# Patient Record
Sex: Male | Born: 2000 | Race: Black or African American | Hispanic: No | Marital: Single | Smoking: Never smoker
Health system: Southern US, Community
[De-identification: ages and names within clinical notes are randomized; demographics above are authoritative.]

## PROBLEM LIST (undated history)

## (undated) HISTORY — PX: WISDOM TOOTH EXTRACTION: SHX21

---
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
## 2022-06-11 ENCOUNTER — Other Ambulatory Visit: Payer: Self-pay

## 2022-06-11 ENCOUNTER — Encounter (HOSPITAL_COMMUNITY): Payer: Self-pay | Admitting: *Deleted

## 2022-06-11 DIAGNOSIS — R112 Nausea with vomiting, unspecified: Secondary | ICD-10-CM | POA: Diagnosis present

## 2022-06-11 DIAGNOSIS — E860 Dehydration: Secondary | ICD-10-CM | POA: Diagnosis not present

## 2022-06-11 DIAGNOSIS — Z20822 Contact with and (suspected) exposure to covid-19: Secondary | ICD-10-CM | POA: Diagnosis not present

## 2022-06-11 LAB — COMPREHENSIVE METABOLIC PANEL
ALT: 14 U/L (ref 0–44)
AST: 23 U/L (ref 15–41)
Albumin: 4.7 g/dL (ref 3.5–5.0)
Alkaline Phosphatase: 91 U/L (ref 38–126)
Anion gap: 11 (ref 5–15)
BUN: 27 mg/dL — ABNORMAL HIGH (ref 6–20)
CO2: 24 mmol/L (ref 22–32)
Calcium: 9.1 mg/dL (ref 8.9–10.3)
Chloride: 102 mmol/L (ref 98–111)
Creatinine, Ser: 0.89 mg/dL (ref 0.61–1.24)
GFR, Estimated: >60 mL/min (ref >60)
Glucose, Bld: 129 mg/dL — ABNORMAL HIGH (ref 70–99)
Potassium: 3.9 mmol/L (ref 3.5–5.1)
Sodium: 137 mmol/L (ref 135–145)
Total Bilirubin: 1.4 mg/dL — ABNORMAL HIGH (ref 0.3–1.2)
Total Protein: 8.5 g/dL — ABNORMAL HIGH (ref 6.5–8.1)

## 2022-06-11 LAB — CBC
HCT: 43.8 % (ref 39.0–52.0)
Hemoglobin: 13.9 g/dL (ref 13.0–17.0)
MCH: 23.5 pg — ABNORMAL LOW (ref 26.0–34.0)
MCHC: 31.7 g/dL (ref 30.0–36.0)
MCV: 74 fL — ABNORMAL LOW (ref 80.0–100.0)
Platelets: 306 10*3/uL (ref 150–400)
RBC: 5.92 MIL/uL — ABNORMAL HIGH (ref 4.22–5.81)
RDW: 14.3 % (ref 11.5–15.5)
WBC: 8.5 10*3/uL (ref 4.0–10.5)
nRBC: 0 % (ref 0.0–0.2)

## 2022-06-11 LAB — LIPASE, BLOOD: Lipase: 25 U/L (ref 11–51)

## 2022-06-11 LAB — RESP PANEL BY RT-PCR (RSV, FLU A&B, COVID)  RVPGX2
Influenza A by PCR: NEGATIVE
Influenza B by PCR: NEGATIVE
Resp Syncytial Virus by PCR: NEGATIVE
SARS Coronavirus 2 by RT PCR: NEGATIVE

## 2022-06-11 NOTE — ED Triage Notes (Signed)
Pt with emesis since this morning, constant throughout the day per family member, generalized weakness.

## 2022-06-12 ENCOUNTER — Emergency Department (HOSPITAL_COMMUNITY)
Admission: EM | Admit: 2022-06-12 | Discharge: 2022-06-12 | Disposition: A | Discharge status: Home / Self Care | Payer: Medicaid Other | Attending: Emergency Medicine | Admitting: Emergency Medicine

## 2022-06-12 DIAGNOSIS — R112 Nausea with vomiting, unspecified: Secondary | ICD-10-CM

## 2022-06-12 DIAGNOSIS — E860 Dehydration: Secondary | ICD-10-CM

## 2022-06-12 LAB — URINALYSIS, ROUTINE W REFLEX MICROSCOPIC
Bacteria, UA: NONE SEEN
Bilirubin Urine: NEGATIVE
Glucose, UA: NEGATIVE mg/dL
Hgb urine dipstick: NEGATIVE
Ketones, ur: 80 mg/dL — AB
Leukocytes,Ua: NEGATIVE
Nitrite: NEGATIVE
Protein, ur: 30 mg/dL — AB
Specific Gravity, Urine: 1.033 — ABNORMAL HIGH (ref 1.005–1.030)
pH: 5 (ref 5.0–8.0)

## 2022-06-12 MED ORDER — LACTATED RINGERS IV BOLUS
1000.0000 mL | Freq: Once | INTRAVENOUS | Status: AC
  Administered 2022-06-12: 1000 mL via INTRAVENOUS

## 2022-06-12 MED ORDER — ONDANSETRON 4 MG PO TBDP: ORAL_TABLET | ORAL | 0 refills | Status: DC

## 2022-06-12 MED ORDER — ONDANSETRON HCL 4 MG/2ML IJ SOLN
4.0000 mg | Freq: Once | INTRAMUSCULAR | Status: AC
  Administered 2022-06-12: 4 mg via INTRAVENOUS
  Filled 2022-06-12: qty 2

## 2022-06-12 NOTE — ED Provider Notes (Signed)
[facility] EMERGENCY DEPARTMENT Provider Note   CSN: [id number] Arrival date & time: 06/11/22  2230     History  Chief Complaint  Patient presents with   Emesis    Bryan Warner is a 21 y.o. male.  Patient comes to the ER for evaluation of nausea and vomiting.  Symptoms present throughout the day.  Patient has not been able to hold anything down.  Tonight he started to feel very weak.  Family member reports that his eyes rolled back and look like he was going to pass out.  He has not experiencing any abdominal pain.  He has not had diarrhea.  No hematemesis.  Vomit has been green.       Home Medications Prior to Admission medications   Medication Sig Start Date End Date Taking? Authorizing Provider  ondansetron (ZOFRAN-ODT) 4 MG disintegrating tablet 4mg  ODT q4 hours prn nausea/vomit 06/12/22  Yes Tierre Netto, Gwenyth Allegra, MD  fluticasone (FLONASE) 50 MCG/ACT nasal spray Place 1 spray into both nostrils 2 (two) times daily as needed for allergies or rhinitis. 10/27/20   Dettinger, Fransisca Kaufmann, MD      Allergies    Patient has no known allergies.    Review of Systems   Review of Systems  Physical Exam Updated Vital Signs BP 105/86 (BP Location: Right Arm)   Pulse (!) 134   Temp 98.3 F (36.8 C) (Oral)   Resp 16   Ht 5\' 9"  (1.753 m)   Wt 49.4 kg   SpO2 100%   BMI 16.10 kg/m  Physical Exam Vitals and nursing note reviewed.  Constitutional:      General: He is not in acute distress.    Appearance: He is well-developed.  HENT:     Head: Normocephalic and atraumatic.     Mouth/Throat:     Mouth: Mucous membranes are moist.  Eyes:     General: Vision grossly intact. Gaze aligned appropriately.     Extraocular Movements: Extraocular movements intact.     Conjunctiva/sclera: Conjunctivae normal.  Cardiovascular:     Rate and Rhythm: Regular rhythm. Tachycardia present.     Pulses: Normal pulses.     Heart sounds: Normal heart sounds, S1 normal and S2 normal.  No murmur heard.    No friction rub. No gallop.  Pulmonary:     Effort: Pulmonary effort is normal. No respiratory distress.     Breath sounds: Normal breath sounds.  Abdominal:     Palpations: Abdomen is soft.     Tenderness: There is no abdominal tenderness. There is no guarding or rebound.     Hernia: No hernia is present.  Musculoskeletal:        General: No swelling.     Cervical back: Full passive range of motion without pain, normal range of motion and neck supple. No pain with movement, spinous process tenderness or muscular tenderness. Normal range of motion.     Right lower leg: No edema.     Left lower leg: No edema.  Skin:    General: Skin is warm and dry.     Capillary Refill: Capillary refill takes less than 2 seconds.     Findings: No ecchymosis, erythema, lesion or wound.  Neurological:     Mental Status: He is alert and oriented to person, place, and time.     GCS: GCS eye subscore is 4. GCS verbal subscore is 5. GCS motor subscore is 6.     Cranial Nerves: Cranial nerves 2-12 are  intact.     Sensory: Sensation is intact.     Motor: Motor function is intact. No weakness or abnormal muscle tone.     Coordination: Coordination is intact.  Psychiatric:        Mood and Affect: Mood normal.        Speech: Speech normal.        Behavior: Behavior normal.     ED Results / Procedures / Treatments   Labs (all labs ordered are listed, but only abnormal results are displayed) Labs Reviewed  COMPREHENSIVE METABOLIC PANEL - Abnormal; Notable for the following components:      Result Value   Glucose, Bld 129 (*)    BUN 27 (*)    Total Protein 8.5 (*)    Total Bilirubin 1.4 (*)    All other components within normal limits  CBC - Abnormal; Notable for the following components:   RBC 5.92 (*)    MCV 74.0 (*)    MCH 23.5 (*)    All other components within normal limits  URINALYSIS, ROUTINE W REFLEX MICROSCOPIC - Abnormal; Notable for the following components:    APPearance HAZY (*)    Specific Gravity, Urine 1.033 (*)    Ketones, ur 80 (*)    Protein, ur 30 (*)    All other components within normal limits  RESP PANEL BY RT-PCR (RSV, FLU A&B, COVID)  RVPGX2  LIPASE, BLOOD    EKG None  Radiology No results found.  Procedures Procedures    Medications Ordered in ED Medications  lactated ringers bolus 1,000 mL (1,000 mLs Intravenous New Bag/Given 06/12/22 0227)    And  lactated ringers bolus 1,000 mL (1,000 mLs Intravenous New Bag/Given 06/12/22 0228)  ondansetron (ZOFRAN) injection 4 mg (4 mg Intravenous Given 06/12/22 0226)    ED Course/ Medical Decision Making/ A&P                           Medical Decision Making Amount and/or Complexity of Data Reviewed Labs: ordered.  Risk Prescription drug management.   Presents to the emergency department for evaluation of nausea and vomiting that has been persistent throughout the day.  He does not have any specific abdominal pain, abdominal exam is benign.  Patient reports worsening generalized weakness throughout the day.  He appears dehydrated at arrival.  Blood work is reassuring.  With a benign abdominal exam, does not require imaging.  Patient aggressively hydrated and will be discharged with continued symptomatic treatment, given return precautions.        Final Clinical Impression(s) / ED Diagnoses Final diagnoses:  Nausea and vomiting, unspecified vomiting type  Dehydration    Rx / DC Orders ED Discharge Orders          Ordered    ondansetron (ZOFRAN-ODT) 4 MG disintegrating tablet        06/12/22 0309              Gilda Crease, MD 06/12/22 [phone]

## 2022-06-14 ENCOUNTER — Telehealth: Payer: Self-pay | Admitting: Family Medicine

## 2022-06-14 NOTE — Telephone Encounter (Signed)
Transition Care Management Unsuccessful Follow-up Telephone Call  Date of discharge and from where:  06/12/22 [facility]  Attempts:  1st Attempt  Reason for unsuccessful TCM follow-up call:  No answer/busy - voicemail has not been set up on either # in chart

## 2023-03-04 DIAGNOSIS — H5213 Myopia, bilateral: Secondary | ICD-10-CM | POA: Diagnosis not present

## 2023-04-08 ENCOUNTER — Encounter: Payer: Self-pay | Admitting: Family Medicine

## 2023-04-08 ENCOUNTER — Ambulatory Visit: Payer: Medicaid Other | Admitting: Family Medicine

## 2023-04-08 ENCOUNTER — Ambulatory Visit (INDEPENDENT_AMBULATORY_CARE_PROVIDER_SITE_OTHER): Payer: Medicaid Other

## 2023-04-08 VITALS — BP 116/75 | Ht 72.0 in | Wt 116.0 lb

## 2023-04-08 DIAGNOSIS — M439 Deforming dorsopathy, unspecified: Secondary | ICD-10-CM

## 2023-04-08 DIAGNOSIS — Z28310 Unvaccinated for covid-19: Secondary | ICD-10-CM

## 2023-04-08 DIAGNOSIS — M549 Dorsalgia, unspecified: Secondary | ICD-10-CM | POA: Diagnosis not present

## 2023-04-08 DIAGNOSIS — L750 Bromhidrosis: Secondary | ICD-10-CM

## 2023-04-08 DIAGNOSIS — Z136 Encounter for screening for cardiovascular disorders: Secondary | ICD-10-CM

## 2023-04-08 DIAGNOSIS — Z0001 Encounter for general adult medical examination with abnormal findings: Secondary | ICD-10-CM | POA: Diagnosis not present

## 2023-04-08 DIAGNOSIS — G8929 Other chronic pain: Secondary | ICD-10-CM | POA: Diagnosis not present

## 2023-04-08 DIAGNOSIS — Z0000 Encounter for general adult medical examination without abnormal findings: Secondary | ICD-10-CM | POA: Diagnosis not present

## 2023-04-08 NOTE — Patient Instructions (Addendum)
Hibiclens - if symptoms do not improve with hibiclens, follow up  Dental list          reviewed 03.08.24 Many of these dentists accept Medicaid.  The list is for your convenience in choosing your child's dentist. Estos dentistas aceptan Medicaid.  La lista es para su [place] y es una cortesa.     [facility]     [phone] [address]  [address] [place] [address] Se habla espaol From 24 to 21 years old Parent may go with child Vinson Moselle DDS     [phone] [address] [place]  [address] Se habla espaol From 78 to 18 years old Parent may NOT go with child  [facility]    [phone] [address] [place] [address] No se habla espaol From birth Parent may not go with child  [facility]     [phone] [address] [place] [address] Se habla espaol From 55 to 27 years old Parent may NOT go with child  Winfield Rast DDS     [phone] [facility]      [address]  [place] [address] No se habla espaol From teeth coming in Parent may go with child  [facility]     [phone] [address] [place]. [place] [address] Requires certification. Call for information. Requiere certificacin. Llame para informacin. Algunos dias se habla espaol  From birth to 20 years Parent possibly goes with child  Bradd Canary DDS     [phone] [id number] [place]  [address] [place] [address] Se habla espaol From 18 months to 18 years  Parent may go with child  J. [place] DDS    [phone] Garlon Hatchet DDS [address] [place] [address] Se habla espaol From 36 year old Parent may go with child  Melynda Ripple DDS    [phone] [address] [place] [address] Se habla espaol  From 6 months old Parent may go with child Dorian Pod DDS    [phone] [address] [place] [address] Se habla  espaol From 26 to 13 years old Parent may go with child        Health Maintenance, Male Adopting a healthy lifestyle and getting preventive care are important in promoting health and wellness. Ask your health care provider about: The right schedule for you to have regular tests and exams. Things you can do on your own to prevent diseases and keep yourself healthy. What should I know about diet, weight, and exercise? Eat a healthy diet  Eat a diet that includes plenty of vegetables, fruits, low-fat dairy products, and lean protein. Do not eat a lot of foods that are high in solid fats, added sugars, or sodium. Maintain a healthy weight Body mass index (BMI) is a measurement that can be used to identify possible weight problems. It estimates body fat based on height and weight. Your health care provider can help determine your BMI and help you achieve or maintain a healthy weight. Get regular exercise Get regular exercise. This is one of the most important things you can do for your health. Most adults should: Exercise for at least 150 minutes each week. The exercise should increase your heart rate and make you sweat (moderate-intensity exercise). Do strengthening exercises at least twice a week. This is in addition to the moderate-intensity exercise. Spend less time sitting. Even light physical activity can be  beneficial. Watch cholesterol and blood lipids Have your blood tested for lipids and cholesterol at 21 years of age, then have this test every 5 years. You may need to have your cholesterol levels checked more often if: Your lipid or cholesterol levels are high. You are older than 21 years of age. You are at high risk for heart disease. What should I know about cancer screening? Many types of cancers can be detected early and may often be prevented. Depending on your health history and family history, you may need to have cancer screening at various ages. This may include screening  for: Colorectal cancer. Prostate cancer. Skin cancer. Lung cancer. What should I know about heart disease, diabetes, and high blood pressure? Blood pressure and heart disease High blood pressure causes heart disease and increases the risk of stroke. This is more likely to develop in people who have high blood pressure readings or are overweight. Talk with your health care provider about your target blood pressure readings. Have your blood pressure checked: Every 3-5 years if you are 54-52 years of age. Every year if you are 9 years old or older. If you are between the ages of 63 and 66 and are a current or former smoker, ask your health care provider if you should have a one-time screening for abdominal aortic aneurysm (AAA). Diabetes Have regular diabetes screenings. This checks your fasting blood sugar level. Have the screening done: Once every three years after age 79 if you are at a normal weight and have a low risk for diabetes. More often and at a younger age if you are overweight or have a high risk for diabetes. What should I know about preventing infection? Hepatitis B If you have a higher risk for hepatitis B, you should be screened for this virus. Talk with your health care provider to find out if you are at risk for hepatitis B infection. Hepatitis C Blood testing is recommended for: Everyone born from [birth year] through 1964. Anyone with known risk factors for hepatitis C. Sexually transmitted infections (STIs) You should be screened each year for STIs, including gonorrhea and chlamydia, if: You are sexually active and are younger than 21 years of age. You are older than 22 years of age and your health care provider tells you that you are at risk for this type of infection. Your sexual activity has changed since you were last screened, and you are at increased risk for chlamydia or gonorrhea. Ask your health care provider if you are at risk. Ask your health care provider about  whether you are at high risk for HIV. Your health care provider may recommend a prescription medicine to help prevent HIV infection. If you choose to take medicine to prevent HIV, you should first get tested for HIV. You should then be tested every 3 months for as long as you are taking the medicine. Follow these instructions at home: Alcohol use Do not drink alcohol if your health care provider tells you not to drink. If you drink alcohol: Limit how much you have to 0-2 drinks a day. Know how much alcohol is in your drink. In the [place] one drink equals one 12 oz bottle of beer (355 mL), one 5 oz glass of wine (148 mL), or one 1 oz glass of hard liquor (44 mL). Lifestyle Do not use any products that contain nicotine or tobacco. These products include cigarettes, chewing tobacco, and vaping devices, such as e-cigarettes. If you need help quitting, ask your  health care provider. Do not use street drugs. Do not share needles. Ask your health care provider for help if you need support or information about quitting drugs. General instructions Schedule regular health, dental, and eye exams. Stay current with your vaccines. Tell your health care provider if: You often feel depressed. You have ever been abused or do not feel safe at home. Summary Adopting a healthy lifestyle and getting preventive care are important in promoting health and wellness. Follow your health care provider's instructions about healthy diet, exercising, and getting tested or screened for diseases. Follow your health care provider's instructions on monitoring your cholesterol and blood pressure. This information is not intended to replace advice given to you by your health care provider. Make sure you discuss any questions you have with your health care provider. Document Revised: 11/03/2020 Document Reviewed: 11/03/2020 Elsevier Patient Education  2024 [employer].

## 2023-04-08 NOTE — Progress Notes (Signed)
Bryan Warner is a 22 y.o. male presents to office today for annual physical exam examination.    Concerns today include: 1. Sweating - feels that he sweats that same amount as other people. States that he feels that he smells more than he should for how clean he is. States that he uses dove with aluminum.    2. Back pain  States that he has back pain from his neck down. States that he is constantly sore. Does not like to take medications. Will not take tylenol or advil regardless of pain. Previously seen by chiropractor, but no longer takes his insurance. Chiropractor helped some.   Occupation: not currently working or in school  Marital status: none  Diet: mainly two meals per day, meat sometimes. Hamburgers  Exercise: none  Substance use: none  Last eye exam: completed 02/2023 [facility]  Last dental exam: does not currently have one, previously treated in [place] for braces. Last seen 6 months ago.  PSA: No significant family history  Refills needed today: none  Other specialists seen: none  Dermatology exam: never done, denies  Fasting today:  yes  Immunizations needed: Flu Vaccine: no  Tdap Vaccine: no  - every 52yrs - (<3 lifetime doses or unknown): all wounds -- look up need for Tetanus IG - (>=3 lifetime doses): clean/minor wound if >49yrs from previous; all other wounds if >46yrs from previous Zoster Vaccine: no (those >50yo, once) Pneumonia Vaccine: no (those w/ risk factors) - (<1yr) Both: Immunocompromised, cochlear implant, CSF leak, asplenic, sickle cell, Chronic Renal Failure - (<63yr) PPSV-23 only: Heart dz, lung disease, DM, tobacco abuse, alcoholism, cirrhosis/liver disease. - (>43yr): PPSV13 then PPSV23 in 6-12mths;  - (>41yr): repeat PPSV23 once if pt received prior to 21yo and 2yrs have passed   History reviewed. No pertinent past medical history. Social History   Socioeconomic History   Marital status: Single    Spouse name: Not on file    Number of children: Not on file   Years of education: Not on file   Highest education level: Not on file  Occupational History   Not on file  Tobacco Use   Smoking status: Never   Smokeless tobacco: Never  Vaping Use   Vaping status: Never Used  Substance and Sexual Activity   Alcohol use: No   Drug use: No   Sexual activity: Not on file  Other Topics Concern   Not on file  Social History Narrative   Not on file   Social Determinants of Health   Financial Resource Strain: Not on file  Food Insecurity: Not on file  Transportation Needs: Not on file  Physical Activity: Not on file  Stress: Not on file  Social Connections: Unknown (11/09/2021)   [facility]   Social Network    Social Network: Not on file  Intimate Partner Violence: Unknown (10/01/2021)   [facility]   HITS    Physically Hurt: Not on file    Insult or Talk Down To: Not on file    Threaten Physical Harm: Not on file    Scream or Curse: Not on file   History reviewed. No pertinent surgical history. Family History  Problem Relation Age of Onset   Hypertension Mother    Diabetes Maternal Grandmother    Hypertension Maternal Grandmother    Hypertension Maternal Grandfather    No current outpatient medications on file.  No Known Allergies   ROS:  Review of Systems Review of Systems  All other systems reviewed and are negative.   Physical exam    04/08/2023    9:12 AM 06/12/2022    4:17 AM 06/11/2022   10:47 PM  Vitals with BMI  Height 6\' 0"   5\' 9"   Weight 116 lbs  109 lbs  BMI 15.73  16.09  Systolic 116 110   Diastolic 75 66   Pulse  107     Physical Exam Constitutional:      General: He is awake. He is not in acute distress.    Appearance: Normal appearance. He is not ill-appearing, toxic-appearing or diaphoretic.     Interventions: He is not intubated. HENT:     Right Ear: No laceration, drainage, swelling or tenderness. No  middle ear effusion. There is no impacted cerumen. Tympanic membrane is not injected, scarred, perforated, erythematous, retracted or bulging.     Left Ear: There is impacted cerumen.     Ears:     Comments: Cerumen present in right ear canal, not occlusive     Mouth/Throat:     Lips: Pink. No lesions.     Mouth: Mucous membranes are moist. No injury or oral lesions.     Dentition: Normal dentition.     Tongue: No lesions.     Palate: No mass.     Pharynx: Oropharynx is clear. No pharyngeal swelling, oropharyngeal exudate, posterior oropharyngeal erythema, uvula swelling or postnasal drip.     Tonsils: No tonsillar exudate or tonsillar abscesses. 2+ on the right. 2+ on the left.  Eyes:     Pupils: Pupils are equal, round, and reactive to light. Pupils are equal.  Neck:     Thyroid: No thyroid mass, thyromegaly or thyroid tenderness.     Trachea: Trachea and phonation normal.  Cardiovascular:     Rate and Rhythm: Normal rate and regular rhythm.     Pulses:          Radial pulses are 2+ on the right side and 2+ on the left side.     Heart sounds: Normal heart sounds.  Pulmonary:     Effort: Pulmonary effort is normal. No tachypnea, bradypnea, accessory muscle usage, prolonged expiration, respiratory distress or retractions. He is not intubated.     Breath sounds: Normal breath sounds. No stridor, decreased air movement or transmitted upper airway sounds. No decreased breath sounds, wheezing, rhonchi or rales.  Abdominal:     General: Abdomen is flat. Bowel sounds are normal.     Palpations: Abdomen is soft. There is no shifting dullness, fluid wave, hepatomegaly, splenomegaly, mass or pulsatile mass.     Tenderness: There is no abdominal tenderness.     Hernia: No hernia is present.  Genitourinary:    Comments: Deferred  Musculoskeletal:     Cervical back: Full passive range of motion without pain. No edema or crepitus. No pain with movement. Normal range of motion.     Thoracic back:  Deformity and tenderness present. No swelling, edema, signs of trauma, lacerations, spasms or bony tenderness. Scoliosis present.     Lumbar back: No swelling, edema, deformity, signs of trauma, lacerations, spasms, tenderness or bony tenderness. Decreased range of motion. Scoliosis present.     Right lower leg: No edema.     Left lower leg: No edema.     Comments: Right scapula raised/more prominent than left scapula   Lymphadenopathy:     Head:     Right side of head: No submental,  submandibular, tonsillar, preauricular or posterior auricular adenopathy.     Left side of head: No submental, submandibular, tonsillar, preauricular or posterior auricular adenopathy.     Cervical: No cervical adenopathy.     Right cervical: No superficial or deep cervical adenopathy.    Left cervical: No superficial or deep cervical adenopathy.  Skin:    General: Skin is warm.     Capillary Refill: Capillary refill takes less than 2 seconds.  Neurological:     General: No focal deficit present.     Mental Status: He is alert, oriented to person, place, and time and easily aroused.     Motor: Motor function is intact.     Coordination: Coordination is intact.     Gait: Gait is intact.  Psychiatric:        Attention and Perception: Attention and perception normal.        Mood and Affect: Mood and affect normal.        Speech: Speech normal.        Behavior: Behavior normal. Behavior is cooperative.        Thought Content: Thought content normal.        Cognition and Memory: Cognition and memory normal.        Judgment: Judgment normal.        04/08/2023    9:41 AM 10/25/2019    1:11 PM  Depression screen PHQ 2/9  Decreased Interest 0 0  Down, Depressed, Hopeless 0 0  PHQ - 2 Score 0 0  Altered sleeping 1   Tired, decreased energy 0   Change in appetite 1   Feeling bad or failure about yourself  0   Trouble concentrating 0   Moving slowly or fidgety/restless 0   Suicidal thoughts 0   PHQ-9  Score 2   Difficult doing work/chores Not difficult at all       04/08/2023    9:41 AM  GAD 7 : Generalized Anxiety Score  Nervous, Anxious, on Edge 0  Control/stop worrying 0  Worry too much - different things 0  Trouble relaxing 0  Restless 0  Easily annoyed or irritable 0  Afraid - awful might happen 0  Total GAD 7 Score 0  Anxiety Difficulty Not difficult at all    Assessment/ Plan: Earline Mayotte here for annual physical exam.  1. Annual visit for general adult medical examination with abnormal findings Discussed with patient to continue healthy lifestyle choices, including diet (rich in fruits, vegetables, and lean proteins, and low in salt and simple carbohydrates) and exercise (at least 30 minutes of moderate physical activity daily). Limit beverages high is sugar. Recommended at least 80-100 oz of water daily.  Labs as below. Will communicate results to patient once available. Will await results to determine next steps.  Declined Vaccines  - CBC with Differential/Platelet - CMP14+EGFR  2. Chronic midline back pain, unspecified back location Referral placed as below. Discussed OTC topical analgesics for pain. Imaging as below. Will communicate results to patient once available. Will await results to determine next steps.  - Ambulatory referral to Physical Therapy - DG SCOLIOSIS EVAL COMPLETE SPINE 2 OR 3 VIEWS; Future  3. Curvature of thoracic spine As above  - DG SCOLIOSIS EVAL COMPLETE SPINE 2 OR 3 VIEWS; Future  4. Encounter for screening for cardiovascular disorders Labs as below. Will communicate results to patient once available. Will await results to determine next steps.  Fasting  - Lipid panel  5. Unvaccinated for  covid-19 Labs as below. Will communicate results to patient once available. Will await results to determine next steps.  - TSH  6. Bromhidrosis Discussed reducing bacteria at the site. Recommend hibiclens washes a few times per week at  the site. Will consider prescription antiperspirant if symptoms continue.   Patient to follow up in 1 year for annual exam or sooner if needed.  The above assessment and management plan was discussed with the patient. The patient verbalized understanding of and has agreed to the management plan. Patient is aware to call the clinic if symptoms persist or worsen. Patient is aware when to return to the clinic for a follow-up visit. Patient educated on when it is appropriate to go to the emergency department.   Neale Burly, DNP-FNP [facility] [address] [place], [place] [address] [phone]

## 2023-04-09 LAB — CBC WITH DIFFERENTIAL/PLATELET
Basophils Absolute: 0.1 10*3/uL (ref 0.0–0.2)
Basos: 1 %
EOS (ABSOLUTE): 0 10*3/uL (ref 0.0–0.4)
Eos: 1 %
Hematocrit: 41.9 % (ref 37.5–51.0)
Hemoglobin: 13.2 g/dL (ref 13.0–17.7)
Immature Grans (Abs): 0 10*3/uL (ref 0.0–0.1)
Immature Granulocytes: 0 %
Lymphocytes Absolute: 3.1 10*3/uL (ref 0.7–3.1)
Lymphs: 53 %
MCH: 23.6 pg — ABNORMAL LOW (ref 26.6–33.0)
MCHC: 31.5 g/dL (ref 31.5–35.7)
MCV: 75 fL — ABNORMAL LOW (ref 79–97)
Monocytes Absolute: 0.4 10*3/uL (ref 0.1–0.9)
Monocytes: 7 %
Neutrophils Absolute: 2.2 10*3/uL (ref 1.4–7.0)
Neutrophils: 38 %
Platelets: 297 10*3/uL (ref 150–450)
RBC: 5.59 x10E6/uL (ref 4.14–5.80)
RDW: 14.2 % (ref 11.6–15.4)
WBC: 5.8 10*3/uL (ref 3.4–10.8)

## 2023-04-09 LAB — LIPID PANEL
Chol/HDL Ratio: 2.7 {ratio} (ref 0.0–5.0)
Cholesterol, Total: 170 mg/dL (ref 100–199)
HDL: 64 mg/dL (ref >39)
LDL Chol Calc (NIH): 96 mg/dL (ref 0–99)
Triglycerides: 47 mg/dL (ref 0–149)
VLDL Cholesterol Cal: 10 mg/dL (ref 5–40)

## 2023-04-09 LAB — TSH: TSH: 1.15 u[IU]/mL (ref 0.450–4.500)

## 2023-04-09 LAB — CMP14+EGFR
ALT: 8 [IU]/L (ref 0–44)
AST: 16 [IU]/L (ref 0–40)
Albumin: 4.6 g/dL (ref 4.3–5.2)
Alkaline Phosphatase: 79 [IU]/L (ref 51–125)
BUN/Creatinine Ratio: 9 (ref 9–20)
BUN: 8 mg/dL (ref 6–20)
Bilirubin Total: 0.5 mg/dL (ref 0.0–1.2)
CO2: 25 mmol/L (ref 20–29)
Calcium: 9.6 mg/dL (ref 8.7–10.2)
Chloride: 103 mmol/L (ref 96–106)
Creatinine, Ser: 0.87 mg/dL (ref 0.76–1.27)
Globulin, Total: 2.6 g/dL (ref 1.5–4.5)
Glucose: 90 mg/dL (ref 70–99)
Potassium: 3.8 mmol/L (ref 3.5–5.2)
Sodium: 141 mmol/L (ref 134–144)
Total Protein: 7.2 g/dL (ref 6.0–8.5)
eGFR: 127 mL/min/{1.73_m2} (ref >59)

## 2023-04-11 ENCOUNTER — Encounter: Payer: Medicaid Other | Admitting: Nurse Practitioner

## 2023-04-20 NOTE — Progress Notes (Signed)
Scoliosis curvature greater than 10 degrees. Will refer to orthopedics for further evaluation and consideration of PT.

## 2023-04-22 ENCOUNTER — Ambulatory Visit: Payer: Medicaid Other | Attending: Family Medicine

## 2023-04-22 ENCOUNTER — Other Ambulatory Visit: Payer: Self-pay

## 2023-04-22 DIAGNOSIS — G8929 Other chronic pain: Secondary | ICD-10-CM | POA: Insufficient documentation

## 2023-04-22 DIAGNOSIS — M546 Pain in thoracic spine: Secondary | ICD-10-CM | POA: Diagnosis present

## 2023-04-22 DIAGNOSIS — M5459 Other low back pain: Secondary | ICD-10-CM | POA: Insufficient documentation

## 2023-04-22 DIAGNOSIS — M549 Dorsalgia, unspecified: Secondary | ICD-10-CM | POA: Diagnosis not present

## 2023-04-22 DIAGNOSIS — M6281 Muscle weakness (generalized): Secondary | ICD-10-CM | POA: Diagnosis present

## 2023-04-22 NOTE — Therapy (Signed)
OUTPATIENT PHYSICAL THERAPY THORACOLUMBAR EVALUATION   Patient Name: Bryan Warner MRN: [id number] DOB:[birth year], 22 y.o., male Today's Date: 04/22/2023  END OF SESSION:  PT End of Session - 04/22/23 0854     Visit Number 1    Number of Visits 8    Date for PT Re-Evaluation 06/24/23    PT Start Time 0856    PT Stop Time 0928    PT Time Calculation (min) 32 min    Activity Tolerance Patient tolerated treatment well    Behavior During Therapy [facility] for tasks assessed/performed             History reviewed. No pertinent past medical history. History reviewed. No pertinent surgical history. There are no problems to display for this patient.  REFERRING PROVIDER: Arrie Senate, FNP   REFERRING DIAG: Chronic midline back pain, unspecified back location   Rationale for Evaluation and Treatment: Rehabilitation  THERAPY DIAG:  Pain in thoracic spine  Other low back pain  Muscle weakness (generalized)  ONSET DATE: 4-6 years ago  SUBJECTIVE:                                                                                                                                                                                           SUBJECTIVE STATEMENT: Patient reports that he began having back pain about 4-6 years ago with no known cause. He notes that it may have been getting slightly worse since it first began.  His mother believes that he may have had a growth spurt about 4 to 6 years ago when his pain first began.  PERTINENT HISTORY:  Unremarkable  PAIN:  Are you having pain? Yes: NPRS scale: 6/10 Pain location: midline thoracic and lumbar spine Pain description: intermittent aching initially, but can be a sharp pain Aggravating factors: standing and walking (15 minutes), lifting Relieving factors: sitting  PRECAUTIONS: None  RED FLAGS: None   WEIGHT BEARING RESTRICTIONS: No  FALLS:  Has patient fallen in last 6 months? No  LIVING  ENVIRONMENT: Lives with: lives with their family Lives in: House/apartment Has following equipment at home: None  OCCUPATION: not working currently  PLOF: Independent  PATIENT GOALS: reduced pain, be able to lift up to 20 pounds (helping his mother)   NEXT MD VISIT: 04/10/2024  OBJECTIVE:  Note: Objective measures were completed at Evaluation unless otherwise noted.  DIAGNOSTIC FINDINGS: 04/08/23 spine x-ray IMPRESSION: Thoracic dextroscoliosis measures 11.2 degrees. Lumbar levoscoliosis measures 7.2 degrees.  SCREENING FOR RED FLAGS: Bowel or bladder incontinence: No Spinal tumors: No Cauda equina syndrome: No Compression fracture: No Abdominal aneurysm: No  COGNITION: Overall cognitive status: Within  functional limits for tasks assessed     SENSATION: Patient reports no numbness or tingling  POSTURE: rounded shoulders  PALPATION: TTP: left thoracic paraspinals  LUMBAR ROM:   AROM eval  Flexion 70  Extension 22  Right lateral flexion 18  Left lateral flexion 10  Right rotation 25% limitation with hips blocked to isolate lumbar mobility   Left rotation 50% limited with hips blocked to isolate lumbar mobility   (Blank rows = not tested)  LOWER EXTREMITY ROM: WFL for activities assessed  LOWER EXTREMITY MMT:    MMT Right eval Left eval  Hip flexion 4+/5 4-/5; left hip pain  Hip extension    Hip abduction    Hip adduction    Hip internal rotation    Hip external rotation    Knee flexion 4/5 4/5  Knee extension 4+/5 4/5  Ankle dorsiflexion 4/5 4/5  Ankle plantarflexion    Ankle inversion    Ankle eversion     (Blank rows = not tested)  LUMBAR SPECIAL TESTS:  Straight leg raise test: Negative  GAIT: Assistive device utilized: None Level of assistance: Complete Independence Comments: No significant gait deviations observed  TODAY'S TREATMENT:                                                                                                                               DATE:     PATIENT EDUCATION:  Education details: Plan of care, prognosis, healing, anatomy, x-ray results, and goals for therapy Person educated: Patient and Parent Education method: Explanation Education comprehension: verbalized understanding  HOME EXERCISE PROGRAM:   ASSESSMENT:  CLINICAL IMPRESSION: Patient is a 22 y.o. male who was seen today for physical therapy evaluation and treatment for chronic thoracic and lumbar pain. He presented with a low to moderate pain severity and irritability with palpation to his left thoracic paraspinals slightly reproducing his familiar symptoms. However, he exhibited increased tone throughout his thoracic and lumbar paraspinals bilaterally. He also exhibited reduced lumbar rotation and sidebending to the left compared to the right. Recommend that he continue with skilled physical therapy to address his impairments to maximize his functional mobility.  OBJECTIVE IMPAIRMENTS: decreased activity tolerance, decreased mobility, decreased ROM, decreased strength, impaired flexibility, impaired tone, improper body mechanics, and pain.   ACTIVITY LIMITATIONS: lifting and standing  PARTICIPATION LIMITATIONS: community activity  PERSONAL FACTORS: Time since onset of injury/illness/exacerbation are also affecting patient's functional outcome.   REHAB POTENTIAL: Fair    CLINICAL DECISION MAKING: Stable/uncomplicated  EVALUATION COMPLEXITY: Low   GOALS: Goals reviewed with patient? Yes  LONG TERM GOALS: Target date: 05/20/23  Patient will be independent with his HEP. Baseline:  Goal status: INITIAL  2.  Patient will be able to complete his daily activities without his familiar symptoms exceeding 4/10. Baseline:  Goal status: INITIAL  3.  Patient will be able to stand for at least 30 minutes without being limited by his familiar symptoms for improved function  with his daily activities. Baseline:  Goal status: INITIAL  4.   Patient will be able to demonstrate proper lifting mechanics to reduce his risk of reinjury. Baseline:  Goal status: INITIAL  5.  Patient will be able to lift at least 20 pounds from the floor to his waist without being limited by his familiar symptoms for improved function with household activities. Baseline:  Goal status: INITIAL  6.  Patient will be able to improve his left hip flexor strength to at least 4/5 for improved function with his daily activities. Baseline:  Goal status: INITIAL  PLAN:  PT FREQUENCY: 2x/week  PT DURATION: 4 weeks  PLANNED INTERVENTIONS: 97164- PT Re-evaluation, 97110-Therapeutic exercises, 97530- Therapeutic activity, 97112- Neuromuscular re-education, 97535- Self Care, 75643- Manual therapy, 97014- Electrical stimulation (unattended), [phone]- Traction (mechanical), Patient/Family education, Taping, Dry Needling, Joint mobilization, Joint manipulation, Spinal manipulation, Spinal mobilization, Cryotherapy, and Moist heat.  PLAN FOR NEXT SESSION: Postural reeducation, lifting mechanics, lumbar and lower extremity strengthening, provide HEP, and modalities as needed   Granville Lewis, PT 04/22/2023, 10:24 AM

## 2023-05-06 ENCOUNTER — Ambulatory Visit: Payer: Medicaid Other | Attending: Family Medicine

## 2023-05-06 DIAGNOSIS — M5459 Other low back pain: Secondary | ICD-10-CM | POA: Diagnosis present

## 2023-05-06 DIAGNOSIS — M6281 Muscle weakness (generalized): Secondary | ICD-10-CM | POA: Insufficient documentation

## 2023-05-06 DIAGNOSIS — M546 Pain in thoracic spine: Secondary | ICD-10-CM | POA: Diagnosis present

## 2023-05-06 NOTE — Therapy (Signed)
OUTPATIENT PHYSICAL THERAPY THORACOLUMBAR TREATMENT   Patient Name: Bryan Warner MRN: [id number] DOB:[birth year], 22 y.o., male Today's Date: 05/06/2023  END OF SESSION:  PT End of Session - 05/06/23 0807     Visit Number 2    Number of Visits 8    Date for PT Re-Evaluation 06/24/23    PT Start Time 0804    PT Stop Time 0842    PT Time Calculation (min) 38 min    Activity Tolerance Patient tolerated treatment well    Behavior During Therapy [facility] for tasks assessed/performed              History reviewed. No pertinent past medical history. History reviewed. No pertinent surgical history. There are no problems to display for this patient.  REFERRING PROVIDER: Arrie Senate, FNP   REFERRING DIAG: Chronic midline back pain, unspecified back location   Rationale for Evaluation and Treatment: Rehabilitation  THERAPY DIAG:  Pain in thoracic spine  Other low back pain  Muscle weakness (generalized)  ONSET DATE: 4-6 years ago  SUBJECTIVE:                                                                                                                                                                                           SUBJECTIVE STATEMENT: Patient reports that his is not hurting, but he has not done much to cause to to hurt recently.   PERTINENT HISTORY:  Unremarkable  PAIN:  Are you having pain? Yes: NPRS scale: 0/10 Pain location: midline thoracic and lumbar spine Pain description: intermittent aching initially, but can be a sharp pain Aggravating factors: standing and walking (15 minutes), lifting Relieving factors: sitting  PRECAUTIONS: None  RED FLAGS: None   WEIGHT BEARING RESTRICTIONS: No  FALLS:  Has patient fallen in last 6 months? No  LIVING ENVIRONMENT: Lives with: lives with their family Lives in: House/apartment Has following equipment at home: None  OCCUPATION: not working currently  PLOF: Independent  PATIENT  GOALS: reduced pain, be able to lift up to 20 pounds (helping his mother)   NEXT MD VISIT: 04/10/2024  OBJECTIVE:  Note: Objective measures were completed at Evaluation unless otherwise noted.  DIAGNOSTIC FINDINGS: 04/08/23 spine x-ray IMPRESSION: Thoracic dextroscoliosis measures 11.2 degrees. Lumbar levoscoliosis measures 7.2 degrees.  SCREENING FOR RED FLAGS: Bowel or bladder incontinence: No Spinal tumors: No Cauda equina syndrome: No Compression fracture: No Abdominal aneurysm: No  COGNITION: Overall cognitive status: Within functional limits for tasks assessed     SENSATION: Patient reports no numbness or tingling  POSTURE: rounded shoulders  PALPATION: TTP: left thoracic paraspinals  LUMBAR ROM:   AROM  eval  Flexion 70  Extension 22  Right lateral flexion 18  Left lateral flexion 10  Right rotation 25% limitation with hips blocked to isolate lumbar mobility   Left rotation 50% limited with hips blocked to isolate lumbar mobility   (Blank rows = not tested)  LOWER EXTREMITY ROM: WFL for activities assessed  LOWER EXTREMITY MMT:    MMT Right eval Left eval  Hip flexion 4+/5 4-/5; left hip pain  Hip extension    Hip abduction    Hip adduction    Hip internal rotation    Hip external rotation    Knee flexion 4/5 4/5  Knee extension 4+/5 4/5  Ankle dorsiflexion 4/5 4/5  Ankle plantarflexion    Ankle inversion    Ankle eversion     (Blank rows = not tested)  LUMBAR SPECIAL TESTS:  Straight leg raise test: Negative  GAIT: Assistive device utilized: None Level of assistance: Complete Independence Comments: No significant gait deviations observed  TODAY'S TREATMENT:                                                                                                                              DATE:                                     05/06/23 EXERCISE LOG  Exercise Repetitions and Resistance Comments  Lower trunk rotation  2 minutes  With  contralateral thoracic rotation  Single leg bridge  25 reps each  Alternating LE  Open books  25 reps each    Cat camel  30 reps    Resisted pull down  Blue XTS x 3 minutes    Wood chops  Blue XTS x 30 reps each    Ball roll out  3.5 minutes  Multidirectional   Wall squat  2 minutes  With red ball behind back  Self STM with lacrosse ball     Blank cell = exercise not performed today   PATIENT EDUCATION:  Education details: Plan of care, prognosis, healing, anatomy, x-ray results, and goals for therapy Person educated: Patient and Parent Education method: Explanation Education comprehension: verbalized understanding  HOME EXERCISE PROGRAM: [email or web address]  ASSESSMENT:  CLINICAL IMPRESSION: Patient was introduced to multiple new interventions for improved lumbar strength and stability needed for functional activities such as lifting. He required minimal cueing with today's interventions for proper exercise performance. He experienced no pain or discomfort with any of today's interventions. He was provided an updated HEP of today's interventions. He reported feeling comfortable with performing these interventions at home.  He reported feeling good upon the conclusion of treatment. He continues to require skilled physical therapy to address his remaining impairments to return to his prior level of function.   OBJECTIVE IMPAIRMENTS: decreased activity tolerance, decreased mobility, decreased ROM, decreased strength, impaired flexibility, impaired tone, improper body mechanics, and pain.  ACTIVITY LIMITATIONS: lifting and standing  PARTICIPATION LIMITATIONS: community activity  PERSONAL FACTORS: Time since onset of injury/illness/exacerbation are also affecting patient's functional outcome.   REHAB POTENTIAL: Fair    CLINICAL DECISION MAKING: Stable/uncomplicated  EVALUATION COMPLEXITY: Low   GOALS: Goals reviewed with patient? Yes  LONG TERM GOALS: Target date: 05/20/23  Patient  will be independent with his HEP. Baseline:  Goal status: INITIAL  2.  Patient will be able to complete his daily activities without his familiar symptoms exceeding 4/10. Baseline:  Goal status: INITIAL  3.  Patient will be able to stand for at least 30 minutes without being limited by his familiar symptoms for improved function with his daily activities. Baseline:  Goal status: INITIAL  4.  Patient will be able to demonstrate proper lifting mechanics to reduce his risk of reinjury. Baseline:  Goal status: INITIAL  5.  Patient will be able to lift at least 20 pounds from the floor to his waist without being limited by his familiar symptoms for improved function with household activities. Baseline:  Goal status: INITIAL  6.  Patient will be able to improve his left hip flexor strength to at least 4/5 for improved function with his daily activities. Baseline:  Goal status: INITIAL  PLAN:  PT FREQUENCY: 2x/week  PT DURATION: 4 weeks  PLANNED INTERVENTIONS: 97164- PT Re-evaluation, 97110-Therapeutic exercises, 97530- Therapeutic activity, 97112- Neuromuscular re-education, 97535- Self Care, 57846- Manual therapy, 97014- Electrical stimulation (unattended), [phone]- Traction (mechanical), Patient/Family education, Taping, Dry Needling, Joint mobilization, Joint manipulation, Spinal manipulation, Spinal mobilization, Cryotherapy, and Moist heat.  PLAN FOR NEXT SESSION: Postural reeducation, lifting mechanics, lumbar and lower extremity strengthening, provide HEP, and modalities as needed   Granville Lewis, PT 05/06/2023, 12:45 PM

## 2023-05-12 ENCOUNTER — Ambulatory Visit: Payer: Medicaid Other | Admitting: *Deleted

## 2023-05-12 ENCOUNTER — Encounter: Payer: Self-pay | Admitting: *Deleted

## 2023-05-12 DIAGNOSIS — M5459 Other low back pain: Secondary | ICD-10-CM

## 2023-05-12 DIAGNOSIS — M6281 Muscle weakness (generalized): Secondary | ICD-10-CM

## 2023-05-12 DIAGNOSIS — M546 Pain in thoracic spine: Secondary | ICD-10-CM | POA: Diagnosis not present

## 2023-05-12 NOTE — Therapy (Signed)
OUTPATIENT PHYSICAL THERAPY THORACOLUMBAR TREATMENT   Patient Name: Bryan Warner MRN: [id number] DOB:05/29/2001, 22 y.o., male Today's Date: 05/12/2023  END OF SESSION:  PT End of Session - 05/12/23 1510     Visit Number 3    Number of Visits 8    Date for PT Re-Evaluation 06/24/23    PT Start Time 1510              History reviewed. No pertinent past medical history. History reviewed. No pertinent surgical history. There are no problems to display for this patient.  REFERRING PROVIDER: Arrie Senate, FNP   REFERRING DIAG: Chronic midline back pain, unspecified back location   Rationale for Evaluation and Treatment: Rehabilitation  THERAPY DIAG:  Pain in thoracic spine  Other low back pain  Muscle weakness (generalized)  ONSET DATE: 4-6 years ago  SUBJECTIVE:                                                                                                                                                                                           SUBJECTIVE STATEMENT: Patient reports that he is doing fairly well with pain today.   PERTINENT HISTORY:  Unremarkable  PAIN:  Are you having pain? Yes: NPRS scale: 0/10 Pain location: midline thoracic and lumbar spine Pain description: intermittent aching initially, but can be a sharp pain Aggravating factors: standing and walking (15 minutes), lifting Relieving factors: sitting  PRECAUTIONS: None  RED FLAGS: None   WEIGHT BEARING RESTRICTIONS: No  FALLS:  Has patient fallen in last 6 months? No  LIVING ENVIRONMENT: Lives with: lives with their family Lives in: House/apartment Has following equipment at home: None  OCCUPATION: not working currently  PLOF: Independent  PATIENT GOALS: reduced pain, be able to lift up to 20 pounds (helping his mother)   NEXT MD VISIT: 04/10/2024  OBJECTIVE:  Note: Objective measures were completed at Evaluation unless otherwise noted.  DIAGNOSTIC  FINDINGS: 04/08/23 spine x-ray IMPRESSION: Thoracic dextroscoliosis measures 11.2 degrees. Lumbar levoscoliosis measures 7.2 degrees.  SCREENING FOR RED FLAGS: Bowel or bladder incontinence: No Spinal tumors: No Cauda equina syndrome: No Compression fracture: No Abdominal aneurysm: No  COGNITION: Overall cognitive status: Within functional limits for tasks assessed     SENSATION: Patient reports no numbness or tingling  POSTURE: rounded shoulders  PALPATION: TTP: left thoracic paraspinals  LUMBAR ROM:   AROM eval  Flexion 70  Extension 22  Right lateral flexion 18  Left lateral flexion 10  Right rotation 25% limitation with hips blocked to isolate lumbar mobility   Left rotation 50% limited with hips blocked to isolate lumbar mobility   (  Blank rows = not tested)  LOWER EXTREMITY ROM: WFL for activities assessed  LOWER EXTREMITY MMT:    MMT Right eval Left eval  Hip flexion 4+/5 4-/5; left hip pain  Hip extension    Hip abduction    Hip adduction    Hip internal rotation    Hip external rotation    Knee flexion 4/5 4/5  Knee extension 4+/5 4/5  Ankle dorsiflexion 4/5 4/5  Ankle plantarflexion    Ankle inversion    Ankle eversion     (Blank rows = not tested)  LUMBAR SPECIAL TESTS:  Straight leg raise test: Negative  GAIT: Assistive device utilized: None Level of assistance: Complete Independence Comments: No significant gait deviations observed  TODAY'S TREATMENT:                                                                                                                              DATE:                                     05/12/23 EXERCISE LOG  Exercise Repetitions and Resistance Comments  TM 2.5  x 10 mins  hands on handles   Lower trunk rotation   With contralateral thoracic rotation  Dying bug X6 hold 10 secs each side   Bridge X 10 hold 10 secs   bridge with march  2x10 Alternating LE  Side lying Hip ABD x10 hold 10 secs   Modified  Bird-dog X3 hold 5 secs Each UE and LE   Open books     Cat camel  10 reps    Resisted pull down     Sit to stand/squat  X10 each working on hinge bendindg   [employer] roll out   Multidirectional   Wall squat   With red ball behind back  Self STM with lacrosse ball     Blank cell = exercise not performed today  Discussed and practiced  movement patterns for squatting to decrease LBP when lifting. Focused more on hinge bending and neutral pelvis.      PATIENT EDUCATION:  Education details: Plan of care, prognosis, healing, anatomy, x-ray results, and goals for therapy Person educated: Patient and Parent Education method: Explanation Education comprehension: verbalized understanding  HOME EXERCISE PROGRAM: [email or web address]  ASSESSMENT:  CLINICAL IMPRESSION: Patient  arrived today doing fairly well with no LBP upon arrival. Rx focused on lifting mechanics with hinge bending/squat when lifting to decrease back pain as well as core activation exs, as well as spinal mobility. HEP handout given with reps and sets. No c/o pain end of session.   OBJECTIVE IMPAIRMENTS: decreased activity tolerance, decreased mobility, decreased ROM, decreased strength, impaired flexibility, impaired tone, improper body mechanics, and pain.   ACTIVITY LIMITATIONS: lifting and standing  PARTICIPATION LIMITATIONS: community activity  PERSONAL FACTORS: Time since onset of  injury/illness/exacerbation are also affecting patient's functional outcome.   REHAB POTENTIAL: Fair    CLINICAL DECISION MAKING: Stable/uncomplicated  EVALUATION COMPLEXITY: Low   GOALS: Goals reviewed with patient? Yes  LONG TERM GOALS: Target date: 05/20/23  Patient will be independent with his HEP. Baseline:  Goal status: INITIAL  2.  Patient will be able to complete his daily activities without his familiar symptoms exceeding 4/10. Baseline:  Goal status: INITIAL  3.  Patient will be able to stand for at least 30  minutes without being limited by his familiar symptoms for improved function with his daily activities. Baseline:  Goal status: INITIAL  4.  Patient will be able to demonstrate proper lifting mechanics to reduce his risk of reinjury. Baseline:  Goal status: INITIAL  5.  Patient will be able to lift at least 20 pounds from the floor to his waist without being limited by his familiar symptoms for improved function with household activities. Baseline:  Goal status: INITIAL  6.  Patient will be able to improve his left hip flexor strength to at least 4/5 for improved function with his daily activities. Baseline:  Goal status: INITIAL  PLAN:  PT FREQUENCY: 2x/week  PT DURATION: 4 weeks  PLANNED INTERVENTIONS: 97164- PT Re-evaluation, 97110-Therapeutic exercises, 97530- Therapeutic activity, 97112- Neuromuscular re-education, 97535- Self Care, [id number]- Manual therapy, 97014- Electrical stimulation (unattended), [phone]- Traction (mechanical), Patient/Family education, Taping, Dry Needling, Joint mobilization, Joint manipulation, Spinal manipulation, Spinal mobilization, Cryotherapy, and Moist heat.  PLAN FOR NEXT SESSION: Postural reeducation, lifting mechanics, lumbar and lower extremity strengthening, provide HEP, and modalities as needed   Brentton Wardlow,CHRIS, PTA 05/12/2023, 4:33 PM

## 2023-05-16 ENCOUNTER — Ambulatory Visit: Payer: Medicaid Other

## 2023-05-18 ENCOUNTER — Ambulatory Visit: Payer: Medicaid Other

## 2023-05-18 DIAGNOSIS — M5459 Other low back pain: Secondary | ICD-10-CM

## 2023-05-18 DIAGNOSIS — M6281 Muscle weakness (generalized): Secondary | ICD-10-CM

## 2023-05-18 DIAGNOSIS — M546 Pain in thoracic spine: Secondary | ICD-10-CM

## 2023-05-18 NOTE — Therapy (Signed)
OUTPATIENT PHYSICAL THERAPY THORACOLUMBAR TREATMENT   Patient Name: Bryan Warner MRN: [id number] DOB:2000-11-13, 22 y.o., male Today's Date: 05/18/2023  END OF SESSION:  PT End of Session - 05/18/23 1347     Visit Number 4    Number of Visits 8    Date for PT Re-Evaluation 06/24/23    PT Start Time 1346    PT Stop Time 1426    PT Time Calculation (min) 40 min    Activity Tolerance Patient tolerated treatment well    Behavior During Therapy [facility] for tasks assessed/performed               History reviewed. No pertinent past medical history. History reviewed. No pertinent surgical history. There are no problems to display for this patient.  REFERRING PROVIDER: Arrie Senate, FNP   REFERRING DIAG: Chronic midline back pain, unspecified back location   Rationale for Evaluation and Treatment: Rehabilitation  THERAPY DIAG:  Pain in thoracic spine  Other low back pain  Muscle weakness (generalized)  ONSET DATE: 4-6 years ago  SUBJECTIVE:                                                                                                                                                                                           SUBJECTIVE STATEMENT: Patient reports that he feels good today.   PERTINENT HISTORY:  Unremarkable  PAIN:  Are you having pain? Yes: NPRS scale: 0/10 Pain location: midline thoracic and lumbar spine Pain description: intermittent aching initially, but can be a sharp pain Aggravating factors: standing and walking (15 minutes), lifting Relieving factors: sitting  PRECAUTIONS: None  RED FLAGS: None   WEIGHT BEARING RESTRICTIONS: No  FALLS:  Has patient fallen in last 6 months? No  LIVING ENVIRONMENT: Lives with: lives with their family Lives in: House/apartment Has following equipment at home: None  OCCUPATION: not working currently  PLOF: Independent  PATIENT GOALS: reduced pain, be able to lift up to 20 pounds  (helping his mother)   NEXT MD VISIT: 04/10/2024  OBJECTIVE:  Note: Objective measures were completed at Evaluation unless otherwise noted.  DIAGNOSTIC FINDINGS: 04/08/23 spine x-ray IMPRESSION: Thoracic dextroscoliosis measures 11.2 degrees. Lumbar levoscoliosis measures 7.2 degrees.  SCREENING FOR RED FLAGS: Bowel or bladder incontinence: No Spinal tumors: No Cauda equina syndrome: No Compression fracture: No Abdominal aneurysm: No  COGNITION: Overall cognitive status: Within functional limits for tasks assessed     SENSATION: Patient reports no numbness or tingling  POSTURE: rounded shoulders  PALPATION: TTP: left thoracic paraspinals  LUMBAR ROM:   AROM eval  Flexion 70  Extension 22  Right lateral flexion  18  Left lateral flexion 10  Right rotation 25% limitation with hips blocked to isolate lumbar mobility   Left rotation 50% limited with hips blocked to isolate lumbar mobility   (Blank rows = not tested)  LOWER EXTREMITY ROM: WFL for activities assessed  LOWER EXTREMITY MMT:    MMT Right eval Left eval  Hip flexion 4+/5 4-/5; left hip pain  Hip extension    Hip abduction    Hip adduction    Hip internal rotation    Hip external rotation    Knee flexion 4/5 4/5  Knee extension 4+/5 4/5  Ankle dorsiflexion 4/5 4/5  Ankle plantarflexion    Ankle inversion    Ankle eversion     (Blank rows = not tested)  LUMBAR SPECIAL TESTS:  Straight leg raise test: Negative  GAIT: Assistive device utilized: None Level of assistance: Complete Independence Comments: No significant gait deviations observed  TODAY'S TREATMENT:                                                                                                                              DATE:                                     05/18/23 EXERCISE LOG  Exercise Repetitions and Resistance Comments  Rocker board  4 minutes   Eccentric heel tap 6" step x 20 reps each    BOSU squat  25 reps  Ball  down; without UE support  Resisted pull downs  Blue XTS x 30 reps   Wood chops  Blue XTS x 20 reps each    SLR w/ hip ABD  20 reps each    Lumbar rotation  Green t-band x 20 reps each    Floor to waist lift  15# x 15 reps  With proper lifting mechanics   Blank cell = exercise not performed today                                    05/12/23 EXERCISE LOG  Exercise Repetitions and Resistance Comments  TM 2.5  x 10 mins  hands on handles   Lower trunk rotation   With contralateral thoracic rotation  Dying bug X6 hold 10 secs each side   Bridge X 10 hold 10 secs   bridge with march  2x10 Alternating LE  Side lying Hip ABD x10 hold 10 secs   Modified Bird-dog X3 hold 5 secs Each UE and LE   Open books     Cat camel  10 reps    Resisted pull down     Sit to stand/squat  X10 each working on hinge bendindg   [employer] roll out   Multidirectional   Wall squat   With  red ball behind back  Self STM with lacrosse ball     Blank cell = exercise not performed today  Discussed and practiced  movement patterns for squatting to decrease LBP when lifting. Focused more on hinge bending and neutral pelvis.  PATIENT EDUCATION:  Education details: HEP and Architectural technologist Person educated: Patient Education method: Explanation Education comprehension: verbalized understanding  HOME EXERCISE PROGRAM: [id number]  ASSESSMENT:  CLINICAL IMPRESSION: Patient was introduced to multiple new interventions for improved lumbar and lower extremity stability with moderate difficulty. He required minimal cueing with today's new interventions for proper exercise performance. He experienced no increase in pain or discomfort with any of today's interventions. He reported feeling "tired, but good" upon the conclusion of treatment. He continues to require skilled physical therapy to address his remaining impairments to return to his prior level of function.    OBJECTIVE IMPAIRMENTS: decreased activity  tolerance, decreased mobility, decreased ROM, decreased strength, impaired flexibility, impaired tone, improper body mechanics, and pain.   ACTIVITY LIMITATIONS: lifting and standing  PARTICIPATION LIMITATIONS: community activity  PERSONAL FACTORS: Time since onset of injury/illness/exacerbation are also affecting patient's functional outcome.   REHAB POTENTIAL: Fair    CLINICAL DECISION MAKING: Stable/uncomplicated  EVALUATION COMPLEXITY: Low   GOALS: Goals reviewed with patient? Yes  LONG TERM GOALS: Target date: 05/20/23  Patient will be independent with his HEP. Baseline:  Goal status: MET  2.  Patient will be able to complete his daily activities without his familiar symptoms exceeding 4/10. Baseline:  Goal status: INITIAL  3.  Patient will be able to stand for at least 30 minutes without being limited by his familiar symptoms for improved function with his daily activities. Baseline: 25-30 minutes  Goal status: NEARLY MET  4.  Patient will be able to demonstrate proper lifting mechanics to reduce his risk of reinjury. Baseline:  Goal status: INITIAL  5.  Patient will be able to lift at least 20 pounds from the floor to his waist without being limited by his familiar symptoms for improved function with household activities. Baseline:  Goal status: INITIAL  6.  Patient will be able to improve his left hip flexor strength to at least 4/5 for improved function with his daily activities. Baseline:  Goal status: INITIAL  PLAN:  PT FREQUENCY: 2x/week  PT DURATION: 4 weeks  PLANNED INTERVENTIONS: 97164- PT Re-evaluation, 97110-Therapeutic exercises, 97530- Therapeutic activity, 97112- Neuromuscular re-education, 97535- Self Care, 82956- Manual therapy, 97014- Electrical stimulation (unattended), [phone]- Traction (mechanical), Patient/Family education, Taping, Dry Needling, Joint mobilization, Joint manipulation, Spinal manipulation, Spinal mobilization, Cryotherapy, and  Moist heat.  PLAN FOR NEXT SESSION: Postural reeducation, lifting mechanics, lumbar and lower extremity strengthening, provide HEP, and modalities as needed   Granville Lewis, PT 05/18/2023, 2:28 PM

## 2023-05-25 ENCOUNTER — Ambulatory Visit: Payer: Medicaid Other

## 2023-05-25 DIAGNOSIS — M5459 Other low back pain: Secondary | ICD-10-CM

## 2023-05-25 DIAGNOSIS — M546 Pain in thoracic spine: Secondary | ICD-10-CM

## 2023-05-25 DIAGNOSIS — M6281 Muscle weakness (generalized): Secondary | ICD-10-CM

## 2023-05-25 NOTE — Therapy (Signed)
OUTPATIENT PHYSICAL THERAPY THORACOLUMBAR TREATMENT   Patient Name: Bryan Warner MRN: [id number] DOB:2000-09-25, 22 y.o., male Today's Date: 05/25/2023  END OF SESSION:  PT End of Session - 05/25/23 1351     Visit Number 5    Number of Visits 8    Date for PT Re-Evaluation 06/24/23    PT Start Time 1349    PT Stop Time 1429    PT Time Calculation (min) 40 min    Activity Tolerance Patient tolerated treatment well    Behavior During Therapy [facility] for tasks assessed/performed                History reviewed. No pertinent past medical history. History reviewed. No pertinent surgical history. There are no problems to display for this patient.  REFERRING PROVIDER: Arrie Senate, FNP   REFERRING DIAG: Chronic midline back pain, unspecified back location   Rationale for Evaluation and Treatment: Rehabilitation  THERAPY DIAG:  Pain in thoracic spine  Other low back pain  Muscle weakness (generalized)  ONSET DATE: 4-6 years ago  SUBJECTIVE:                                                                                                                                                                                           SUBJECTIVE STATEMENT: Patient reports that his back is not hurting right now and it is not hurting as much as it used to. The last time that his back really bothered him was about a week ago.   PERTINENT HISTORY:  Unremarkable  PAIN:  Are you having pain? Yes: NPRS scale: 0/10 Pain location: midline thoracic and lumbar spine Pain description: intermittent aching initially, but can be a sharp pain Aggravating factors: standing and walking (15 minutes), lifting Relieving factors: sitting  PRECAUTIONS: None  RED FLAGS: None   WEIGHT BEARING RESTRICTIONS: No  FALLS:  Has patient fallen in last 6 months? No  LIVING ENVIRONMENT: Lives with: lives with their family Lives in: House/apartment Has following equipment at home:  None  OCCUPATION: not working currently  PLOF: Independent  PATIENT GOALS: reduced pain, be able to lift up to 20 pounds (helping his mother)   NEXT MD VISIT: 04/10/2024  OBJECTIVE:  Note: Objective measures were completed at Evaluation unless otherwise noted.  DIAGNOSTIC FINDINGS: 04/08/23 spine x-ray IMPRESSION: Thoracic dextroscoliosis measures 11.2 degrees. Lumbar levoscoliosis measures 7.2 degrees.  SCREENING FOR RED FLAGS: Bowel or bladder incontinence: No Spinal tumors: No Cauda equina syndrome: No Compression fracture: No Abdominal aneurysm: No  COGNITION: Overall cognitive status: Within functional limits for tasks assessed     SENSATION: Patient reports no numbness  or tingling  POSTURE: rounded shoulders  PALPATION: TTP: left thoracic paraspinals  LUMBAR ROM:   AROM eval  Flexion 70  Extension 22  Right lateral flexion 18  Left lateral flexion 10  Right rotation 25% limitation with hips blocked to isolate lumbar mobility   Left rotation 50% limited with hips blocked to isolate lumbar mobility   (Blank rows = not tested)  LOWER EXTREMITY ROM: WFL for activities assessed  LOWER EXTREMITY MMT:    MMT Right eval Left eval  Hip flexion 4+/5 4-/5; left hip pain  Hip extension    Hip abduction    Hip adduction    Hip internal rotation    Hip external rotation    Knee flexion 4/5 4/5  Knee extension 4+/5 4/5  Ankle dorsiflexion 4/5 4/5  Ankle plantarflexion    Ankle inversion    Ankle eversion     (Blank rows = not tested)  LUMBAR SPECIAL TESTS:  Straight leg raise test: Negative  GAIT: Assistive device utilized: None Level of assistance: Complete Independence Comments: No significant gait deviations observed  TODAY'S TREATMENT:                                                                                                                              DATE:                                     05/25/23 EXERCISE LOG  Exercise Repetitions  and Resistance Comments  Resisted pull down  Blue XTS x 3 minutes   Rocker board  5 minutes   Cybex knee flexion  40# x 2.5 minutes   Cybex knee extension 20# x 2.5 minutes   Resisted wood chops  Blue XTS x 30 reps each    Cybex leg press 1.5 plates; seat 6 x 2.5 minutes   Marching on [employer] up x 3 minutes  BUE support  BOSU circles  Ball down x 3.5 minutes 1-2 HHA    Blank cell = exercise not performed today                                    05/18/23 EXERCISE LOG  Exercise Repetitions and Resistance Comments  Rocker board  4 minutes   Eccentric heel tap 6" step x 20 reps each    BOSU squat  25 reps  Ball down; without UE support  Resisted pull downs  Blue XTS x 30 reps   Wood chops  Blue XTS x 20 reps each    SLR w/ hip ABD  20 reps each    Lumbar rotation  Green t-band x 20 reps each    Floor to waist lift  15# x 15 reps  With proper lifting mechanics   Blank cell = exercise not performed  today                                    05/12/23 EXERCISE LOG  Exercise Repetitions and Resistance Comments  TM 2.5  x 10 mins  hands on handles   Lower trunk rotation   With contralateral thoracic rotation  Dying bug X6 hold 10 secs each side   Bridge X 10 hold 10 secs   bridge with march  2x10 Alternating LE  Side lying Hip ABD x10 hold 10 secs   Modified Bird-dog X3 hold 5 secs Each UE and LE   Open books     Cat camel  10 reps    Resisted pull down     Sit to stand/squat  X10 each working on hinge bendindg   [employer] roll out   Multidirectional   Wall squat   With red ball behind back  Self STM with lacrosse ball     Blank cell = exercise not performed today  Discussed and practiced  movement patterns for squatting to decrease LBP when lifting. Focused more on hinge bending and neutral pelvis.  PATIENT EDUCATION:  Education details: HEP and Architectural technologist Person educated: Patient Education method: Explanation Education comprehension: verbalized  understanding  HOME EXERCISE PROGRAM: [email or web address]  ASSESSMENT:  CLINICAL IMPRESSION: Patient was progressed with new interventions for improved lower extremity strength needed for his daily activities. He required minimal verbal cueing for improved eccentric hamstring control with weighted knee flexion for improved muscular demand. He experienced no increase in pain or discomfort with any of today's interventions. He reported feeling tired, but good upon the conclusion of treatment. He continues to require skilled physical therapy to address his remaining impairments to return to his prior level of function.   OBJECTIVE IMPAIRMENTS: decreased activity tolerance, decreased mobility, decreased ROM, decreased strength, impaired flexibility, impaired tone, improper body mechanics, and pain.   ACTIVITY LIMITATIONS: lifting and standing  PARTICIPATION LIMITATIONS: community activity  PERSONAL FACTORS: Time since onset of injury/illness/exacerbation are also affecting patient's functional outcome.   REHAB POTENTIAL: Fair    CLINICAL DECISION MAKING: Stable/uncomplicated  EVALUATION COMPLEXITY: Low   GOALS: Goals reviewed with patient? Yes  LONG TERM GOALS: Target date: 05/20/23  Patient will be independent with his HEP. Baseline:  Goal status: MET  2.  Patient will be able to complete his daily activities without his familiar symptoms exceeding 4/10. Baseline:  Goal status: INITIAL  3.  Patient will be able to stand for at least 30 minutes without being limited by his familiar symptoms for improved function with his daily activities. Baseline: 25-30 minutes  Goal status: NEARLY MET  4.  Patient will be able to demonstrate proper lifting mechanics to reduce his risk of reinjury. Baseline:  Goal status: INITIAL  5.  Patient will be able to lift at least 20 pounds from the floor to his waist without being limited by his familiar symptoms for improved function with household  activities. Baseline:  Goal status: INITIAL  6.  Patient will be able to improve his left hip flexor strength to at least 4/5 for improved function with his daily activities. Baseline:  Goal status: INITIAL  PLAN:  PT FREQUENCY: 2x/week  PT DURATION: 4 weeks  PLANNED INTERVENTIONS: 97164- PT Re-evaluation, 97110-Therapeutic exercises, 97530- Therapeutic activity, 97112- Neuromuscular re-education, 97535- Self Care, 44034- Manual therapy, 97014- Electrical stimulation (unattended), 74259- Traction (mechanical),  Patient/Family education, Taping, Dry Needling, Joint mobilization, Joint manipulation, Spinal manipulation, Spinal mobilization, Cryotherapy, and Moist heat.  PLAN FOR NEXT SESSION: Postural reeducation, lifting mechanics, lumbar and lower extremity strengthening, provide HEP, and modalities as needed   Granville Lewis, PT 05/25/2023, 2:46 PM

## 2023-05-30 ENCOUNTER — Ambulatory Visit: Payer: Medicaid Other | Attending: Family Medicine

## 2023-05-30 DIAGNOSIS — M5459 Other low back pain: Secondary | ICD-10-CM | POA: Insufficient documentation

## 2023-05-30 DIAGNOSIS — M6281 Muscle weakness (generalized): Secondary | ICD-10-CM | POA: Diagnosis present

## 2023-05-30 DIAGNOSIS — M546 Pain in thoracic spine: Secondary | ICD-10-CM | POA: Diagnosis present

## 2023-05-30 NOTE — Therapy (Signed)
OUTPATIENT PHYSICAL THERAPY THORACOLUMBAR TREATMENT   Patient Name: Bryan Warner MRN: [id number] DOB:[birth year], 22 y.o., male Today's Date: 05/30/2023  END OF SESSION:  PT End of Session - 05/30/23 1519     Visit Number 6    Number of Visits 8    Date for PT Re-Evaluation 06/24/23    PT Start Time 1515    PT Stop Time 1547    PT Time Calculation (min) 32 min    Activity Tolerance Patient tolerated treatment well    Behavior During Therapy [facility] for tasks assessed/performed                History reviewed. No pertinent past medical history. History reviewed. No pertinent surgical history. There are no problems to display for this patient.  REFERRING PROVIDER: Arrie Senate, FNP   REFERRING DIAG: Chronic midline back pain, unspecified back location   Rationale for Evaluation and Treatment: Rehabilitation  THERAPY DIAG:  Pain in thoracic spine  Other low back pain  Muscle weakness (generalized)  ONSET DATE: 4-6 years ago  SUBJECTIVE:                                                                                                                                                                                           SUBJECTIVE STATEMENT: Patient reports that his back is not hurting right now and it is not hurting as much as it used to. The last time that his back really bothered him was about a week ago.   PERTINENT HISTORY:  Unremarkable  PAIN:  Are you having pain? Yes: NPRS scale: 0/10 Pain location: midline thoracic and lumbar spine Pain description: intermittent aching initially, but can be a sharp pain Aggravating factors: standing and walking (15 minutes), lifting Relieving factors: sitting  PRECAUTIONS: None  RED FLAGS: None   WEIGHT BEARING RESTRICTIONS: No  FALLS:  Has patient fallen in last 6 months? No  LIVING ENVIRONMENT: Lives with: lives with their family Lives in: House/apartment Has following equipment at home:  None  OCCUPATION: not working currently  PLOF: Independent  PATIENT GOALS: reduced pain, be able to lift up to 20 pounds (helping his mother)   NEXT MD VISIT: 04/10/2024  OBJECTIVE:  Note: Objective measures were completed at Evaluation unless otherwise noted.  DIAGNOSTIC FINDINGS: 04/08/23 spine x-ray IMPRESSION: Thoracic dextroscoliosis measures 11.2 degrees. Lumbar levoscoliosis measures 7.2 degrees.  SCREENING FOR RED FLAGS: Bowel or bladder incontinence: No Spinal tumors: No Cauda equina syndrome: No Compression fracture: No Abdominal aneurysm: No  COGNITION: Overall cognitive status: Within functional limits for tasks assessed     SENSATION: Patient reports no numbness  or tingling  POSTURE: rounded shoulders  PALPATION: TTP: left thoracic paraspinals  LUMBAR ROM:   AROM eval  Flexion 70  Extension 22  Right lateral flexion 18  Left lateral flexion 10  Right rotation 25% limitation with hips blocked to isolate lumbar mobility   Left rotation 50% limited with hips blocked to isolate lumbar mobility   (Blank rows = not tested)  LOWER EXTREMITY ROM: WFL for activities assessed  LOWER EXTREMITY MMT:    MMT Right eval Left eval  Hip flexion 4+/5 4-/5; left hip pain  Hip extension    Hip abduction    Hip adduction    Hip internal rotation    Hip external rotation    Knee flexion 4/5 4/5  Knee extension 4+/5 4/5  Ankle dorsiflexion 4/5 4/5  Ankle plantarflexion    Ankle inversion    Ankle eversion     (Blank rows = not tested)  LUMBAR SPECIAL TESTS:  Straight leg raise test: Negative  GAIT: Assistive device utilized: None Level of assistance: Complete Independence Comments: No significant gait deviations observed  TODAY'S TREATMENT:                                                                                                                              DATE:                                     05/25/23 EXERCISE LOG  Exercise Repetitions  and Resistance Comments  Resisted pull down  Orange XTS x 3 minutes   Rocker board  5 minutes   Cybex knee flexion  50# x 3.5 minutes   Cybex knee extension 20# x 3.5 minutes   Resisted wood chops  Orange XTS x 20 reps each    Cybex leg press 2 plates; seat 6 x 2.5 minutes   Marching on BOSU  BUE support  BOSU circles   1-2 HHA    Blank cell = exercise not performed today                                    05/18/23 EXERCISE LOG  Exercise Repetitions and Resistance Comments  Rocker board  4 minutes   Eccentric heel tap 6" step x 20 reps each    BOSU squat  25 reps  Ball down; without UE support  Resisted pull downs  Blue XTS x 30 reps   Wood chops  Blue XTS x 20 reps each    SLR w/ hip ABD  20 reps each    Lumbar rotation  Green t-band x 20 reps each    Floor to waist lift  15# x 15 reps  With proper lifting mechanics   Blank cell = exercise not performed today  05/12/23 EXERCISE LOG  Exercise Repetitions and Resistance Comments  TM 2.5  x 10 mins  hands on handles   Lower trunk rotation   With contralateral thoracic rotation  Dying bug X6 hold 10 secs each side   Bridge X 10 hold 10 secs   bridge with march  2x10 Alternating LE  Side lying Hip ABD x10 hold 10 secs   Modified Bird-dog X3 hold 5 secs Each UE and LE   Open books     Cat camel  10 reps    Resisted pull down     Sit to stand/squat  X10 each working on hinge bendindg   [employer] roll out   Multidirectional   Wall squat   With red ball behind back  Self STM with lacrosse ball     Blank cell = exercise not performed today  Discussed and practiced  movement patterns for squatting to decrease LBP when lifting. Focused more on hinge bending and neutral pelvis.  PATIENT EDUCATION:  Education details: HEP and Architectural technologist Person educated: Patient Education method: Explanation Education comprehension: verbalized understanding  HOME EXERCISE  PROGRAM: [email or web address]  ASSESSMENT:  CLINICAL IMPRESSION: Pt arrives for today's treatment session denying any pain.  Pt able to tolerate increased time with all cybex exercises today as well as increased weight with leg press.  Pt also able to tolerate increased resistance with standing XTS exercises.  Pt requiring seated rest break between XTS exercises due to fatigue. Pt drinking water throughout treatment session as needed.  Pt denied any pain at completion of today's treatment session.  OBJECTIVE IMPAIRMENTS: decreased activity tolerance, decreased mobility, decreased ROM, decreased strength, impaired flexibility, impaired tone, improper body mechanics, and pain.   ACTIVITY LIMITATIONS: lifting and standing  PARTICIPATION LIMITATIONS: community activity  PERSONAL FACTORS: Time since onset of injury/illness/exacerbation are also affecting patient's functional outcome.   REHAB POTENTIAL: Fair    CLINICAL DECISION MAKING: Stable/uncomplicated  EVALUATION COMPLEXITY: Low   GOALS: Goals reviewed with patient? Yes  LONG TERM GOALS: Target date: 05/20/23  Patient will be independent with his HEP. Baseline:  Goal status: MET  2.  Patient will be able to complete his daily activities without his familiar symptoms exceeding 4/10. Baseline:  Goal status: MET  3.  Patient will be able to stand for at least 30 minutes without being limited by his familiar symptoms for improved function with his daily activities. Baseline: 25-30 minutes  Goal status: NEARLY MET  4.  Patient will be able to demonstrate proper lifting mechanics to reduce his risk of reinjury. Baseline:  Goal status: INITIAL  5.  Patient will be able to lift at least 20 pounds from the floor to his waist without being limited by his familiar symptoms for improved function with household activities. Baseline:  Goal status: INITIAL  6.  Patient will be able to improve his left hip flexor strength to at least 4/5 for  improved function with his daily activities. Baseline:  Goal status: INITIAL  PLAN:  PT FREQUENCY: 2x/week  PT DURATION: 4 weeks  PLANNED INTERVENTIONS: 97164- PT Re-evaluation, 97110-Therapeutic exercises, 97530- Therapeutic activity, 97112- Neuromuscular re-education, 97535- Self Care, 43329- Manual therapy, 97014- Electrical stimulation (unattended), [phone]- Traction (mechanical), Patient/Family education, Taping, Dry Needling, Joint mobilization, Joint manipulation, Spinal manipulation, Spinal mobilization, Cryotherapy, and Moist heat.  PLAN FOR NEXT SESSION: Postural reeducation, lifting mechanics, lumbar and lower extremity strengthening, provide HEP, and modalities as needed   Newman Pies, PTA  05/30/2023, 4:00 PM

## 2023-05-31 ENCOUNTER — Ambulatory Visit: Payer: Medicaid Other | Admitting: Orthopedic Surgery

## 2023-06-07 ENCOUNTER — Encounter: Payer: Self-pay | Admitting: Orthopedic Surgery

## 2023-06-07 ENCOUNTER — Ambulatory Visit (INDEPENDENT_AMBULATORY_CARE_PROVIDER_SITE_OTHER): Payer: Medicaid Other | Admitting: Orthopedic Surgery

## 2023-06-07 VITALS — BP 126/86 | HR 108 | Ht 72.0 in | Wt 115.0 lb

## 2023-06-07 DIAGNOSIS — M41125 Adolescent idiopathic scoliosis, thoracolumbar region: Secondary | ICD-10-CM | POA: Diagnosis not present

## 2023-06-08 ENCOUNTER — Encounter: Payer: Self-pay | Admitting: Orthopedic Surgery

## 2023-06-08 ENCOUNTER — Ambulatory Visit: Payer: Medicaid Other

## 2023-06-08 DIAGNOSIS — M546 Pain in thoracic spine: Secondary | ICD-10-CM | POA: Diagnosis not present

## 2023-06-08 DIAGNOSIS — M6281 Muscle weakness (generalized): Secondary | ICD-10-CM

## 2023-06-08 DIAGNOSIS — M5459 Other low back pain: Secondary | ICD-10-CM

## 2023-06-08 NOTE — Therapy (Signed)
OUTPATIENT PHYSICAL THERAPY THORACOLUMBAR TREATMENT   Patient Name: Bryan STAMATIS MRN: [id number] DOB:04/11/2001, 22 y.o., male Today's Date: 06/08/2023  END OF SESSION:  PT End of Session - 06/08/23 1515     Visit Number 7    Number of Visits 8    Date for PT Re-Evaluation 06/24/23    PT Start Time 1513    PT Stop Time 1553    PT Time Calculation (min) 40 min    Activity Tolerance Patient tolerated treatment well    Behavior During Therapy [facility] for tasks assessed/performed                 History reviewed. No pertinent past medical history. History reviewed. No pertinent surgical history. There are no problems to display for this patient.  REFERRING PROVIDER: Arrie Senate, FNP   REFERRING DIAG: Chronic midline back pain, unspecified back location   Rationale for Evaluation and Treatment: Rehabilitation  THERAPY DIAG:  Pain in thoracic spine  Other low back pain  Muscle weakness (generalized)  ONSET DATE: 4-6 years ago  SUBJECTIVE:                                                                                                                                                                                           SUBJECTIVE STATEMENT: Patient reports that he Warner not hurting today.   PERTINENT HISTORY:  Unremarkable  PAIN:  Are you having pain? Yes: NPRS scale: 0/10 Pain location: midline thoracic and lumbar spine Pain description: intermittent aching initially, but can be a sharp pain Aggravating factors: standing and walking (15 minutes), lifting Relieving factors: sitting  PRECAUTIONS: None  RED FLAGS: None   WEIGHT BEARING RESTRICTIONS: No  FALLS:  Has patient fallen in last 6 months? No  LIVING ENVIRONMENT: Lives with: lives with their family Lives in: House/apartment Has following equipment at home: None  OCCUPATION: not working currently  PLOF: Independent  PATIENT GOALS: reduced pain, be able to lift up to 20  pounds (helping his mother)   NEXT MD VISIT: 04/10/2024  OBJECTIVE:  Note: Objective measures were completed at Evaluation unless otherwise noted.  DIAGNOSTIC FINDINGS: 04/08/23 spine x-ray IMPRESSION: Thoracic dextroscoliosis measures 11.2 degrees. Lumbar levoscoliosis measures 7.2 degrees.  SCREENING FOR RED FLAGS: Bowel or bladder incontinence: No Spinal tumors: No Cauda equina syndrome: No Compression fracture: No Abdominal aneurysm: No  COGNITION: Overall cognitive status: Within functional limits for tasks assessed     SENSATION: Patient reports no numbness or tingling  POSTURE: rounded shoulders  PALPATION: TTP: left thoracic paraspinals  LUMBAR ROM:   AROM eval  Flexion 70  Extension 22  Right lateral flexion 18  Left lateral flexion 10  Right rotation 25% limitation with hips blocked to isolate lumbar mobility   Left rotation 50% limited with hips blocked to isolate lumbar mobility   (Blank rows = not tested)  LOWER EXTREMITY ROM: WFL for activities assessed  LOWER EXTREMITY MMT:    MMT Right eval Left eval  Hip flexion 4+/5 4-/5; left hip pain  Hip extension    Hip abduction    Hip adduction    Hip internal rotation    Hip external rotation    Knee flexion 4/5 4/5  Knee extension 4+/5 4/5  Ankle dorsiflexion 4/5 4/5  Ankle plantarflexion    Ankle inversion    Ankle eversion     (Blank rows = not tested)  LUMBAR SPECIAL TESTS:  Straight leg raise test: Negative  GAIT: Assistive device utilized: None Level of assistance: Complete Independence Comments: No significant gait deviations observed  TODAY'S TREATMENT:                                                                                                                              DATE:                                     06/08/23 EXERCISE LOG  Exercise Repetitions and Resistance Comments  Ball roll out  3 minutes   Cybex knee flexion  50# x 2.5 minutes   Cybex knee extension   30# x 3 minutes   Multifidus rotation  Blue t-band x 25 reps each    Resisted pull down  Orange XTS x 3 minutes   Cybex leg press  2 plates @ seat 6 x 3 minutes    Rocker board  5 minutes   Unilateral carry  10# x 3 laps each  "Felt better carrying in my left hand"   Weighted lumbar side bend  10# x 20 reps each    Eccentric heel tap  6" step x 25 reps each    Blank cell = exercise not performed today                                    05/25/23 EXERCISE LOG  Exercise Repetitions and Resistance Comments  Resisted pull down  Orange XTS x 3 minutes   Rocker board  5 minutes   Cybex knee flexion  50# x 3.5 minutes   Cybex knee extension 20# x 3.5 minutes   Resisted wood chops  Orange XTS x 20 reps each    Cybex leg press 2 plates; seat 6 x 2.5 minutes   Marching on BOSU  BUE support  BOSU circles   1-2 HHA    Blank cell = exercise not performed today  05/18/23 EXERCISE LOG  Exercise Repetitions and Resistance Comments  Rocker board  4 minutes   Eccentric heel tap 6" step x 20 reps each    BOSU squat  25 reps  Ball down; without UE support  Resisted pull downs  Blue XTS x 30 reps   Wood chops  Blue XTS x 20 reps each    SLR w/ hip ABD  20 reps each    Lumbar rotation  Green t-band x 20 reps each    Floor to waist lift  15# x 15 reps  With proper lifting mechanics   Blank cell = exercise not performed today   PATIENT EDUCATION:  Education details: HEP and Architectural technologist Person educated: Patient Education method: Explanation Education comprehension: verbalized understanding  HOME EXERCISE PROGRAM: [email or web address]  ASSESSMENT:  CLINICAL IMPRESSION: Patient was introduced to multiple new interventions for improved lumbar stability. He required minimal cueing with multifidus rotations to keep his elbows extended to promote increased lumbar engagement. He experienced a mild increase in his familiar symptoms with carrying ten pounds in his right hand,  but this did not persist into any of today's other interventions. He reported that his back felt "alright" upon the conclusion of treatment. He continues to require skilled physical therapy to address his remaining impairments to return to his prior level of function.    OBJECTIVE IMPAIRMENTS: decreased activity tolerance, decreased mobility, decreased ROM, decreased strength, impaired flexibility, impaired tone, improper body mechanics, and pain.   ACTIVITY LIMITATIONS: lifting and standing  PARTICIPATION LIMITATIONS: community activity  PERSONAL FACTORS: Time since onset of injury/illness/exacerbation are also affecting patient's functional outcome.   REHAB POTENTIAL: Fair    CLINICAL DECISION MAKING: Stable/uncomplicated  EVALUATION COMPLEXITY: Low   GOALS: Goals reviewed with patient? Yes  LONG TERM GOALS: Target date: 05/20/23  Patient will be independent with his HEP. Baseline:  Goal status: MET  2.  Patient will be able to complete his daily activities without his familiar symptoms exceeding 4/10. Baseline:  Goal status: MET  3.  Patient will be able to stand for at least 30 minutes without being limited by his familiar symptoms for improved function with his daily activities. Baseline: 25-30 minutes  Goal status: NEARLY MET  4.  Patient will be able to demonstrate proper lifting mechanics to reduce his risk of reinjury. Baseline:  Goal status: INITIAL  5.  Patient will be able to lift at least 20 pounds from the floor to his waist without being limited by his familiar symptoms for improved function with household activities. Baseline:  Goal status: INITIAL  6.  Patient will be able to improve his left hip flexor strength to at least 4/5 for improved function with his daily activities. Baseline:  Goal status: INITIAL  PLAN:  PT FREQUENCY: 2x/week  PT DURATION: 4 weeks  PLANNED INTERVENTIONS: 97164- PT Re-evaluation, 97110-Therapeutic exercises, 97530-  Therapeutic activity, 97112- Neuromuscular re-education, 97535- Self Care, 16109- Manual therapy, 97014- Electrical stimulation (unattended), [phone]- Traction (mechanical), Patient/Family education, Taping, Dry Needling, Joint mobilization, Joint manipulation, Spinal manipulation, Spinal mobilization, Cryotherapy, and Moist heat.  PLAN FOR NEXT SESSION: Postural reeducation, lifting mechanics, lumbar and lower extremity strengthening, provide HEP, and modalities as needed   Granville Lewis, PT 06/08/2023, 4:29 PM

## 2023-06-08 NOTE — Progress Notes (Signed)
New Patient Visit  Assessment: Bryan Warner is a 22 y.o. male with the following: Thoracic back pain Scoliosis  Plan: Bryan Warner presents today complaining of back pain.  He has been working with physical therapy, and notes improvement in his symptoms.  X-rays were previously obtained, and incidentally he was found to have scoliosis.  Mild curvature.  Large curve is measured at 11 degrees.  Natural progression of scoliosis was discussed.  Nothing further is needed.  Continue medications as needed.  Urged him to continue working with physical therapy, especially as he has noted improvement.  In the future, if he has back pains, he may need to return to physical therapy, or continue do the exercises on his own.  He states understanding.  He will follow-up as needed.  Follow-up: Return if symptoms worsen or fail to improve.  Subjective:  Chief Complaint  Patient presents with   Back Pain    Pt with back pain for 2 yrs. Pt did have a major growth spurt from [employer]    History of Present Illness: Bryan Warner is a 22 y.o. male who has been referred by  Bryan Burly, FNP for evaluation of back pain.  He states he had back pain for a couple of years.  No specific injury.  Mom notes that he had a major growth spurt as he transition from middle school to high school.  He is not complaining of any numbness or tingling.  Occasional medicines.  He has been working with physical therapy for the past month.  He notes improvement with the therapy exercises.   Review of Systems: No fevers or chills No numbness or tingling No chest pain No shortness of breath No bowel or bladder dysfunction No GI distress No headaches   Medical History:  No past medical history on file.  No past surgical history on file.  Family History  Problem Relation Age of Onset   Hypertension Mother    Diabetes Maternal Grandmother    Hypertension Maternal Grandmother    Hypertension  Maternal Grandfather    Social History   Tobacco Use   Smoking status: Never   Smokeless tobacco: Never  Vaping Use   Vaping status: Never Used  Substance Use Topics   Alcohol use: No   Drug use: No    No Known Allergies  No outpatient medications have been marked as taking for the 06/07/23 encounter (Office Visit) with Oliver Barre, MD.    Objective: BP 126/86   Pulse (!) 108   Ht 6' (1.829 m)   Wt 115 lb (52.2 kg)   BMI 15.60 kg/m   Physical Exam:  General: Alert and oriented. and No acute distress. Gait: Normal gait.  Evaluation of his back demonstrates a mild right-sided thoracic hump.  Right side is more prominent than the left.  Shoulders are level.  Pelvis is level.  He has excellent strength in bilateral upper extremities, as well as bilateral lower extremities.  2+ patellar tendon reflex.  2+ Achilles tendon reflexes.  Sensation intact throughout all 4 extremities.  IMAGING: I personally reviewed images previously obtained in clinic  IMPRESSION: Thoracic dextroscoliosis measures 11.2 degrees. Lumbar levoscoliosis measures 7.2 degrees.    New Medications:  No orders of the defined types were placed in this encounter.     Oliver Barre, MD  06/08/2023 8:18 AM

## 2023-06-13 ENCOUNTER — Ambulatory Visit: Payer: Medicaid Other

## 2023-06-13 DIAGNOSIS — M546 Pain in thoracic spine: Secondary | ICD-10-CM | POA: Diagnosis not present

## 2023-06-13 DIAGNOSIS — M6281 Muscle weakness (generalized): Secondary | ICD-10-CM

## 2023-06-13 DIAGNOSIS — M5459 Other low back pain: Secondary | ICD-10-CM

## 2023-06-13 NOTE — Therapy (Addendum)
OUTPATIENT PHYSICAL THERAPY THORACOLUMBAR TREATMENT   Patient Name: Bryan Warner MRN: [id number] DOB:2001/02/09, 22 y.o., male Today's Date: 06/13/2023  END OF SESSION:  PT End of Session - 06/13/23 1520     Visit Number 8    Number of Visits 8    Date for PT Re-Evaluation 06/24/23    PT Start Time 1520    PT Stop Time 1545    PT Time Calculation (min) 25 min    Activity Tolerance Patient tolerated treatment well    Behavior During Therapy [facility] for tasks assessed/performed                 History reviewed. No pertinent past medical history. History reviewed. No pertinent surgical history. There are no active problems to display for this patient.  REFERRING PROVIDER: Arrie Senate, FNP   REFERRING DIAG: Chronic midline back pain, unspecified back location   Rationale for Evaluation and Treatment: Rehabilitation  THERAPY DIAG:  Pain in thoracic spine  Other low back pain  Muscle weakness (generalized)  ONSET DATE: 4-6 years ago  SUBJECTIVE:                                                                                                                                                                                           SUBJECTIVE STATEMENT: Patient reports that he is not hurting today.   PERTINENT HISTORY:  Unremarkable  PAIN:  Are you having pain? Yes: NPRS scale: 0/10 Pain location: midline thoracic and lumbar spine Pain description: intermittent aching initially, but can be a sharp pain Aggravating factors: standing and walking (15 minutes), lifting Relieving factors: sitting  PRECAUTIONS: None  RED FLAGS: None   WEIGHT BEARING RESTRICTIONS: No  FALLS:  Has patient fallen in last 6 months? No  LIVING ENVIRONMENT: Lives with: lives with their family Lives in: House/apartment Has following equipment at home: None  OCCUPATION: not working currently  PLOF: Independent  PATIENT GOALS: reduced pain, be able to lift up  to 20 pounds (helping his mother)   NEXT MD VISIT: 04/10/2024  OBJECTIVE:  Note: Objective measures were completed at Evaluation unless otherwise noted.  DIAGNOSTIC FINDINGS: 04/08/23 spine x-ray IMPRESSION: Thoracic dextroscoliosis measures 11.2 degrees. Lumbar levoscoliosis measures 7.2 degrees.  SCREENING FOR RED FLAGS: Bowel or bladder incontinence: No Spinal tumors: No Cauda equina syndrome: No Compression fracture: No Abdominal aneurysm: No  COGNITION: Overall cognitive status: Within functional limits for tasks assessed     SENSATION: Patient reports no numbness or tingling  POSTURE: rounded shoulders  PALPATION: TTP: left thoracic paraspinals  LUMBAR ROM:   AROM eval  Flexion 70  Extension 22  Right lateral flexion 18  Left lateral flexion 10  Right rotation 25% limitation with hips blocked to isolate lumbar mobility   Left rotation 50% limited with hips blocked to isolate lumbar mobility   (Blank rows = not tested)  LOWER EXTREMITY ROM: WFL for activities assessed  LOWER EXTREMITY MMT:    MMT Right eval Left eval  Hip flexion 4+/5 4-/5; left hip pain  Hip extension    Hip abduction    Hip adduction    Hip internal rotation    Hip external rotation    Knee flexion 4/5 4/5  Knee extension 4+/5 4/5  Ankle dorsiflexion 4/5 4/5  Ankle plantarflexion    Ankle inversion    Ankle eversion     (Blank rows = not tested)  LUMBAR SPECIAL TESTS:  Straight leg raise test: Negative  GAIT: Assistive device utilized: None Level of assistance: Complete Independence Comments: No significant gait deviations observed  TODAY'S TREATMENT:                                                                                                                              DATE:                                     06/13/23 EXERCISE LOG  Exercise Repetitions and Resistance Comments  Ball roll out  3 minutes   Cybex knee flexion  50# x 3 minutes   Cybex knee extension   30# x 3 minutes   Multifidus rotation  Blue t-band x 25 reps each    Resisted pull down  Orange XTS x 3 minutes   Cybex leg press  2 plates @ seat 6 x 3.5 minutes    Rocker board  5 minutes   Unilateral carry   "Felt better carrying in my left hand"   Weighted lumbar side bend     Eccentric heel tap      Blank cell = exercise not performed today                                    05/25/23 EXERCISE LOG  Exercise Repetitions and Resistance Comments  Resisted pull down  Orange XTS x 3 minutes   Rocker board  5 minutes   Cybex knee flexion  50# x 3.5 minutes   Cybex knee extension 20# x 3.5 minutes   Resisted wood chops  Orange XTS x 20 reps each    Cybex leg press 2 plates; seat 6 x 2.5 minutes   Marching on BOSU  BUE support  BOSU circles   1-2 HHA    Blank cell = exercise not performed today  05/18/23 EXERCISE LOG  Exercise Repetitions and Resistance Comments  Rocker board  4 minutes   Eccentric heel tap 6" step x 20 reps each    BOSU squat  25 reps  Ball down; without UE support  Resisted pull downs  Blue XTS x 30 reps   Wood chops  Blue XTS x 20 reps each    SLR w/ hip ABD  20 reps each    Lumbar rotation  Green t-band x 20 reps each    Floor to waist lift  15# x 15 reps  With proper lifting mechanics   Blank cell = exercise not performed today   PATIENT EDUCATION:  Education details: HEP and Architectural technologist Person educated: Patient Education method: Explanation Education comprehension: verbalized understanding  HOME EXERCISE PROGRAM: [id number]  ASSESSMENT:  CLINICAL IMPRESSION: Pt arrives for today's treatment session denying any pain.  Pt able to demonstrate 4+/5 left hip flexion strength today.  Pt also able to demonstrate and verbally explain proper lifting mechanics with 20# box.  Pt has met all goals set forth by physical therapy at this time. Pt and pt's mother encouraged to call the facility with any questions or concerns.   Pt denied any pain at completion of today's treatment session.  Pt ready for discharge.   PHYSICAL THERAPY DISCHARGE SUMMARY  Visits from Start of Care: 8  Current functional level related to goals / functional outcomes: Patient was able to meet all of his goals for physical therapy. He felt comfortable being discharged at this time.    Remaining deficits: None    Education / Equipment: HEP   Patient agrees to discharge. Patient goals were met. Patient is being discharged due to meeting the stated rehab goals.  Candi Leash, PT, DPT    OBJECTIVE IMPAIRMENTS: decreased activity tolerance, decreased mobility, decreased ROM, decreased strength, impaired flexibility, impaired tone, improper body mechanics, and pain.   ACTIVITY LIMITATIONS: lifting and standing  PARTICIPATION LIMITATIONS: community activity  PERSONAL FACTORS: Time since onset of injury/illness/exacerbation are also affecting patient's functional outcome.   REHAB POTENTIAL: Fair    CLINICAL DECISION MAKING: Stable/uncomplicated  EVALUATION COMPLEXITY: Low   GOALS: Goals reviewed with patient? Yes  LONG TERM GOALS: Target date: 05/20/23  Patient will be independent with his HEP. Baseline:  Goal status: MET  2.  Patient will be able to complete his daily activities without his familiar symptoms exceeding 4/10. Baseline:  Goal status: MET  3.  Patient will be able to stand for at least 30 minutes without being limited by his familiar symptoms for improved function with his daily activities. Baseline: 25-30 minutes  Goal status:  MET  4.  Patient will be able to demonstrate proper lifting mechanics to reduce his risk of reinjury. Baseline:  Goal status: MET  5.  Patient will be able to lift at least 20 pounds from the floor to his waist without being limited by his familiar symptoms for improved function with household activities. Baseline:  Goal status: MET  6.  Patient will be able to improve his  left hip flexor strength to at least 4/5 for improved function with his daily activities. Baseline:  Goal status: MET  PLAN:  PT FREQUENCY: 2x/week  PT DURATION: 4 weeks  PLANNED INTERVENTIONS: 97164- PT Re-evaluation, 97110-Therapeutic exercises, 97530- Therapeutic activity, 97112- Neuromuscular re-education, 97535- Self Care, 46962- Manual therapy, 97014- Electrical stimulation (unattended), [phone]- Traction (mechanical), Patient/Family education, Taping, Dry Needling, Joint mobilization, Joint manipulation, Spinal manipulation, Spinal mobilization, Cryotherapy, and Moist  heat.  PLAN FOR NEXT SESSION: Postural reeducation, lifting mechanics, lumbar and lower extremity strengthening, provide HEP, and modalities as needed   Newman Pies, PTA 06/13/2023, 3:53 PM

## 2023-06-17 ENCOUNTER — Encounter: Payer: Self-pay | Admitting: Family Medicine

## 2023-06-17 ENCOUNTER — Ambulatory Visit (INDEPENDENT_AMBULATORY_CARE_PROVIDER_SITE_OTHER): Payer: Medicaid Other | Admitting: Family Medicine

## 2023-06-17 VITALS — BP 120/83 | HR 94 | Temp 97.4°F | Ht 72.0 in | Wt 115.6 lb

## 2023-06-17 DIAGNOSIS — K625 Hemorrhage of anus and rectum: Secondary | ICD-10-CM | POA: Diagnosis not present

## 2023-06-17 LAB — CBC WITH DIFFERENTIAL/PLATELET
Basophils Absolute: 0 10*3/uL (ref 0.0–0.2)
Basos: 1 %
EOS (ABSOLUTE): 0.1 10*3/uL (ref 0.0–0.4)
Eos: 1 %
Hematocrit: 43.3 % (ref 37.5–51.0)
Hemoglobin: 13.8 g/dL (ref 13.0–17.7)
Immature Grans (Abs): 0 10*3/uL (ref 0.0–0.1)
Immature Granulocytes: 0 %
Lymphocytes Absolute: 2.8 10*3/uL (ref 0.7–3.1)
Lymphs: 45 %
MCH: 23.7 pg — ABNORMAL LOW (ref 26.6–33.0)
MCHC: 31.9 g/dL (ref 31.5–35.7)
MCV: 74 fL — ABNORMAL LOW (ref 79–97)
Monocytes Absolute: 0.5 10*3/uL (ref 0.1–0.9)
Monocytes: 7 %
Neutrophils Absolute: 2.8 10*3/uL (ref 1.4–7.0)
Neutrophils: 46 %
Platelets: 340 10*3/uL (ref 150–450)
RBC: 5.83 x10E6/uL — ABNORMAL HIGH (ref 4.14–5.80)
RDW: 14 % (ref 11.6–15.4)
WBC: 6.2 10*3/uL (ref 3.4–10.8)

## 2023-06-17 MED ORDER — DOCUSATE SODIUM 100 MG PO CAPS: 100.0000 mg | ORAL_CAPSULE | Freq: Two times a day (BID) | ORAL | 0 refills | Status: DC

## 2023-06-17 MED ORDER — HYDROCORTISONE 1 % EX CREA: 1.0000 | TOPICAL_CREAM | Freq: Two times a day (BID) | CUTANEOUS | 0 refills | Status: AC

## 2023-06-17 NOTE — Progress Notes (Signed)
Subjective:  Patient ID: Bryan Warner, male    DOB: [birth year], 22 y.o.   MRN: [id number]  Patient Care Team: Arrie Senate, FNP as PCP - General (Family Medicine)   Chief Complaint:  Hematuria (For about a year now pt has been having blood in urine, was not every time until about a month ago when it became every time)   HPI: Bryan Warner is a 22 y.o. male presenting on 06/17/2023 for Hematuria (For about a year now pt has been having blood in urine, was not every time until about a month ago when it became every time) States that he noticed bleeding when he has a bowel movement. States that it started one year ago, but that one month ago it started daily. States that it started off occurring only when he wipes and now it happens every time he has BM and he now sees droplets in the bowl. States that he is straining and pushing. Has BM every 2-4 days. Reports that it is painful after with burning sensation for about 30 minutes after using the bathroom.   Relevant past medical, surgical, family, and social history reviewed and updated as indicated.  Allergies and medications reviewed and updated. Data reviewed: Chart in Epic.  History reviewed. No pertinent past medical history.  History reviewed. No pertinent surgical history.  Social History   Socioeconomic History   Marital status: Single    Spouse name: Not on file   Number of children: Not on file   Years of education: Not on file   Highest education level: Not on file  Occupational History   Not on file  Tobacco Use   Smoking status: Never   Smokeless tobacco: Never  Vaping Use   Vaping status: Never Used  Substance and Sexual Activity   Alcohol use: No   Drug use: No   Sexual activity: Not on file  Other Topics Concern   Not on file  Social History Narrative   Not on file   Social Drivers of Health   Financial Resource Strain: Not on file  Food Insecurity: Not on file  Transportation  Needs: Not on file  Physical Activity: Not on file  Stress: Not on file  Social Connections: Unknown (11/09/2021)   Received from [facility]   Social Network    Social Network: Not on file  Intimate Partner Violence: Unknown (10/01/2021)   Received from [facility], [facility]   HITS    Physically Hurt: Not on file    Insult or Talk Down To: Not on file    Threaten Physical Harm: Not on file    Scream or Curse: Not on file    No outpatient encounter medications on file as of 06/17/2023.   No facility-administered encounter medications on file as of 06/17/2023.    No Known Allergies  Review of Systems As per HPI Objective:  BP 120/83   Pulse 94   Temp (!) 97.4 F (36.3 C) (Temporal)   Ht 6' (1.829 m)   Wt 115 lb 9.6 oz (52.4 kg)   SpO2 100%   BMI 15.68 kg/m    Wt Readings from Last 3 Encounters:  06/17/23 115 lb 9.6 oz (52.4 kg)  06/07/23 115 lb (52.2 kg)  04/08/23 116 lb (52.6 kg)    Physical Exam Constitutional:      General: He is awake. He is not in acute distress.    Appearance: Normal appearance. He is  well-developed and well-groomed. He is not ill-appearing, toxic-appearing or diaphoretic.  Cardiovascular:     Rate and Rhythm: Normal rate and regular rhythm.     Pulses: Normal pulses.          Radial pulses are 2+ on the right side and 2+ on the left side.       Posterior tibial pulses are 2+ on the right side and 2+ on the left side.     Heart sounds: Normal heart sounds. No murmur heard.    No gallop.  Pulmonary:     Effort: Pulmonary effort is normal. No respiratory distress.     Breath sounds: Normal breath sounds. No stridor. No wheezing, rhonchi or rales.  Genitourinary:    Rectum: External hemorrhoid present.       Comments: Soft, reducible nodule on lower aspect of anus, non-tender to palpation  Musculoskeletal:     Cervical back: Full passive range of motion without pain and neck supple.     Right lower leg: No edema.      Left lower leg: No edema.  Skin:    General: Skin is warm.     Capillary Refill: Capillary refill takes less than 2 seconds.  Neurological:     General: No focal deficit present.     Mental Status: He is alert, oriented to person, place, and time and easily aroused. Mental status is at baseline.     GCS: GCS eye subscore is 4. GCS verbal subscore is 5. GCS motor subscore is 6.     Motor: No weakness.  Psychiatric:        Attention and Perception: Attention and perception normal.        Mood and Affect: Mood and affect normal.        Speech: Speech normal.        Behavior: Behavior normal. Behavior is cooperative.        Thought Content: Thought content normal. Thought content does not include homicidal or suicidal ideation. Thought content does not include homicidal or suicidal plan.        Cognition and Memory: Cognition and memory normal.        Judgment: Judgment normal.      Results for orders placed or performed in visit on 04/08/23  CBC with Differential/Platelet   Collection Time: 04/08/23  9:54 AM  Result Value Ref Range   WBC 5.8 3.4 - 10.8 x10E3/uL   RBC 5.59 4.14 - 5.80 x10E6/uL   Hemoglobin 13.2 13.0 - 17.7 g/dL   Hematocrit 78.2 95.6 - 51.0 %   MCV 75 (L) 79 - 97 fL   MCH 23.6 (L) 26.6 - 33.0 pg   MCHC 31.5 31.5 - 35.7 g/dL   RDW 21.3 08.6 - 57.8 %   Platelets 297 150 - 450 x10E3/uL   Neutrophils 38 Not Estab. %   Lymphs 53 Not Estab. %   Monocytes 7 Not Estab. %   Eos 1 Not Estab. %   Basos 1 Not Estab. %   Neutrophils Absolute 2.2 1.4 - 7.0 x10E3/uL   Lymphocytes Absolute 3.1 0.7 - 3.1 x10E3/uL   Monocytes Absolute 0.4 0.1 - 0.9 x10E3/uL   EOS (ABSOLUTE) 0.0 0.0 - 0.4 x10E3/uL   Basophils Absolute 0.1 0.0 - 0.2 x10E3/uL   Immature Granulocytes 0 Not Estab. %   Immature Grans (Abs) 0.0 0.0 - 0.1 x10E3/uL  CMP14+EGFR   Collection Time: 04/08/23  9:54 AM  Result Value Ref Range   Glucose 90 70 -  99 mg/dL   BUN 8 6 - 20 mg/dL   Creatinine, Ser 1.61  0.76 - 1.27 mg/dL   eGFR 096 >04 VW/UJW/1.19   BUN/Creatinine Ratio 9 9 - 20   Sodium 141 134 - 144 mmol/L   Potassium 3.8 3.5 - 5.2 mmol/L   Chloride 103 96 - 106 mmol/L   CO2 25 20 - 29 mmol/L   Calcium 9.6 8.7 - 10.2 mg/dL   Total Protein 7.2 6.0 - 8.5 g/dL   Albumin 4.6 4.3 - 5.2 g/dL   Globulin, Total 2.6 1.5 - 4.5 g/dL   Bilirubin Total 0.5 0.0 - 1.2 mg/dL   Alkaline Phosphatase 79 51 - 125 IU/L   AST 16 0 - 40 IU/L   ALT 8 0 - 44 IU/L  Lipid panel   Collection Time: 04/08/23  9:54 AM  Result Value Ref Range   Cholesterol, Total 170 100 - 199 mg/dL   Triglycerides 47 0 - 149 mg/dL   HDL 64 >14 mg/dL   VLDL Cholesterol Cal 10 5 - 40 mg/dL   LDL Chol Calc (NIH) 96 0 - 99 mg/dL   Chol/HDL Ratio 2.7 0.0 - 5.0 ratio  TSH   Collection Time: 04/08/23  9:54 AM  Result Value Ref Range   TSH 1.150 0.450 - 4.500 uIU/mL       04/08/2023    9:41 AM 10/25/2019    1:11 PM  Depression screen PHQ 2/9  Decreased Interest 0 0  Down, Depressed, Hopeless 0 0  PHQ - 2 Score 0 0  Altered sleeping 1   Tired, decreased energy 0   Change in appetite 1   Feeling bad or failure about yourself  0   Trouble concentrating 0   Moving slowly or fidgety/restless 0   Suicidal thoughts 0   PHQ-9 Score 2   Difficult doing work/chores Not difficult at all        04/08/2023    9:41 AM  GAD 7 : Generalized Anxiety Score  Nervous, Anxious, on Edge 0  Control/stop worrying 0  Worry too much - different things 0  Trouble relaxing 0  Restless 0  Easily annoyed or irritable 0  Afraid - awful might happen 0  Total GAD 7 Score 0  Anxiety Difficulty Not difficult at all      Pertinent labs & imaging results that were available during my care of the patient were reviewed by me and considered in my medical decision making.  Assessment & Plan:  Torren was seen today for hematuria.  Diagnoses and all orders for this visit:  Rectal bleeding Will treat as below for suspected hemorrhoid.  Labs as below to monitor. Will communicate results to patient once available. Will await results to determine next steps. Will have close follow up to ensure response to treatment.  -     hydrocortisone cream 1 %; Apply 1 Application topically 2 (two) times daily. -     docusate sodium (COLACE) 100 MG capsule; Take 1 capsule (100 mg total) by mouth 2 (two) times daily. -     CBC with Differential/Platelet     Continue all other maintenance medications.  Follow up plan: Return in about 2 weeks (around 07/01/2023) for follow up.   Continue healthy lifestyle choices, including diet (rich in fruits, vegetables, and lean proteins, and low in salt and simple carbohydrates) and exercise (at least 30 minutes of moderate physical activity daily).  Written and verbal instructions provided   The above assessment  and management plan was discussed with the patient. The patient verbalized understanding of and has agreed to the management plan. Patient is aware to call the clinic if they develop any new symptoms or if symptoms persist or worsen. Patient is aware when to return to the clinic for a follow-up visit. Patient educated on when it is appropriate to go to the emergency department.   Neale Burly, DNP-FNP [facility] [address] [place], [place] [address] [phone]

## 2023-06-18 NOTE — Progress Notes (Signed)
Slight variations, but not anemic. Continue current plan. Follow up if not improving.

## 2023-07-05 ENCOUNTER — Ambulatory Visit (INDEPENDENT_AMBULATORY_CARE_PROVIDER_SITE_OTHER): Payer: Medicaid Other | Admitting: Family Medicine

## 2023-07-05 ENCOUNTER — Encounter: Payer: Self-pay | Admitting: Family Medicine

## 2023-07-05 VITALS — BP 106/75 | HR 95 | Temp 98.2°F | Ht 71.5 in | Wt 119.0 lb

## 2023-07-05 DIAGNOSIS — K625 Hemorrhage of anus and rectum: Secondary | ICD-10-CM | POA: Diagnosis not present

## 2023-07-05 MED ORDER — POLYETHYLENE GLYCOL 3350 17 GM/SCOOP PO POWD: 1.0000 | Freq: Once | ORAL | 0 refills | Status: AC

## 2023-07-05 NOTE — Patient Instructions (Addendum)
 Dulcolax (Docusate Sodium) Liquid

## 2023-07-05 NOTE — Progress Notes (Signed)
 Subjective:  Patient ID: Bryan Warner, male    DOB: 2000-09-01, 23 y.o.   MRN: [id number]  Patient Care Team: Cathlene Marry Lenis, FNP as PCP - General (Family Medicine)   Chief Complaint:  Rectal Bleeding (2 week f/u/Little better but unresolved)  HPI: Bryan Warner is a 23 y.o. male presenting on 07/05/2023 for Rectal Bleeding (2 week f/u/Little better but unresolved) Patient presents today for follow up of bleeding from rectum. States that it originally started one year ago, but that ~1 month ago it started occurring daily. Reports that at first it was only with wiping, then with every BM and he was seeing droplets of blood in the toilet bowl. Reports that he is straining and pushing with BM's. Reports BM every 2-4 days. He was prescribed hydrocortisone  cream 2 weeks ago and reports that symptoms are a little better, but have not fully resolved. He reports that he did not use hydrocortisone  cream as he did not have irritation. States that he only took one dose of colace. States that he is still seeing blood, but not as much. He mainly sees it now when he wipes after a BM.   Relevant past medical, surgical, family, and social history reviewed and updated as indicated.  Allergies and medications reviewed and updated. Data reviewed: Chart in Epic.   Social History   Socioeconomic History   Marital status: Single    Spouse name: Not on file   Number of children: Not on file   Years of education: Not on file   Highest education level: Not on file  Occupational History   Not on file  Tobacco Use   Smoking status: Never   Smokeless tobacco: Never  Vaping Use   Vaping status: Never Used  Substance and Sexual Activity   Alcohol use: No   Drug use: No   Sexual activity: Not on file  Other Topics Concern   Not on file  Social History Narrative   Not on file   Social Drivers of Health   Financial Resource Strain: Not on file  Food Insecurity: Not on file   Transportation Needs: Not on file  Physical Activity: Not on file  Stress: Not on file  Social Connections: Unknown (11/09/2021)   Received from [facility], [facility]   Social Network    Social Network: Not on file  Intimate Partner Violence: Unknown (10/01/2021)   Received from [facility], [facility]   HITS    Physically Hurt: Not on file    Insult or Talk Down To: Not on file    Threaten Physical Harm: Not on file    Scream or Curse: Not on file    Outpatient Encounter Medications as of 07/05/2023  Medication Sig   docusate sodium  (COLACE) 100 MG capsule Take 1 capsule (100 mg total) by mouth 2 (two) times daily.   hydrocortisone  cream 1 % Apply 1 Application topically 2 (two) times daily.   No facility-administered encounter medications on file as of 07/05/2023.   No Known Allergies  Review of Systems As per HPI  Objective:  BP 106/75   Pulse 95   Temp 98.2 F (36.8 C)   Ht 5' 11.5 (1.816 m)   Wt 119 lb (54 kg)   SpO2 98%   BMI 16.37 kg/m    Wt Readings from Last 3 Encounters:  06/17/23 115 lb 9.6 oz (52.4 kg)  06/07/23 115 lb (52.2 kg)  04/08/23 116 lb (52.6 kg)  Physical Exam Constitutional:      General: He is awake. He is not in acute distress.    Appearance: Normal appearance. He is well-developed and well-groomed. He is not ill-appearing, toxic-appearing or diaphoretic.  Cardiovascular:     Rate and Rhythm: Normal rate and regular rhythm.     Pulses: Normal pulses.          Radial pulses are 2+ on the right side and 2+ on the left side.       Posterior tibial pulses are 2+ on the right side and 2+ on the left side.     Heart sounds: Normal heart sounds. No murmur heard.    No gallop.  Pulmonary:     Effort: Pulmonary effort is normal. No respiratory distress.     Breath sounds: Normal breath sounds. No stridor. No wheezing, rhonchi or rales.  Musculoskeletal:     Cervical back: Full passive range of motion without pain and neck supple.      Right lower leg: No edema.     Left lower leg: No edema.  Skin:    General: Skin is warm.     Capillary Refill: Capillary refill takes less than 2 seconds.  Neurological:     General: No focal deficit present.     Mental Status: He is alert, oriented to person, place, and time and easily aroused. Mental status is at baseline.     GCS: GCS eye subscore is 4. GCS verbal subscore is 5. GCS motor subscore is 6.     Motor: No weakness.  Psychiatric:        Attention and Perception: Attention and perception normal.        Mood and Affect: Mood and affect normal.        Speech: Speech normal.        Behavior: Behavior normal. Behavior is cooperative.        Thought Content: Thought content normal. Thought content does not include homicidal or suicidal ideation. Thought content does not include homicidal or suicidal plan.        Cognition and Memory: Cognition and memory normal.        Judgment: Judgment normal.      Results for orders placed or performed in visit on 06/17/23  CBC with Differential/Platelet   Collection Time: 06/17/23  4:08 PM  Result Value Ref Range   WBC 6.2 3.4 - 10.8 x10E3/uL   RBC 5.83 (H) 4.14 - 5.80 x10E6/uL   Hemoglobin 13.8 13.0 - 17.7 g/dL   Hematocrit 56.6 62.4 - 51.0 %   MCV 74 (L) 79 - 97 fL   MCH 23.7 (L) 26.6 - 33.0 pg   MCHC 31.9 31.5 - 35.7 g/dL   RDW 85.9 88.3 - 84.5 %   Platelets 340 150 - 450 x10E3/uL   Neutrophils 46 Not Estab. %   Lymphs 45 Not Estab. %   Monocytes 7 Not Estab. %   Eos 1 Not Estab. %   Basos 1 Not Estab. %   Neutrophils Absolute 2.8 1.4 - 7.0 x10E3/uL   Lymphocytes Absolute 2.8 0.7 - 3.1 x10E3/uL   Monocytes Absolute 0.5 0.1 - 0.9 x10E3/uL   EOS (ABSOLUTE) 0.1 0.0 - 0.4 x10E3/uL   Basophils Absolute 0.0 0.0 - 0.2 x10E3/uL   Immature Granulocytes 0 Not Estab. %   Immature Grans (Abs) 0.0 0.0 - 0.1 x10E3/uL       07/05/2023    1:48 PM 04/08/2023    9:41 AM 10/25/2019  1:11 PM  Depression screen PHQ 2/9  Decreased  Interest 0 0 0  Down, Depressed, Hopeless 0 0 0  PHQ - 2 Score 0 0 0  Altered sleeping  1   Tired, decreased energy  0   Change in appetite  1   Feeling bad or failure about yourself   0   Trouble concentrating  0   Moving slowly or fidgety/restless  0   Suicidal thoughts  0   PHQ-9 Score  2   Difficult doing work/chores  Not difficult at all        07/05/2023    1:48 PM 04/08/2023    9:41 AM  GAD 7 : Generalized Anxiety Score  Nervous, Anxious, on Edge 0 0  Control/stop worrying 0 0  Worry too much - different things 0 0  Trouble relaxing 0 0  Restless 0 0  Easily annoyed or irritable 0 0  Afraid - awful might happen 0 0  Total GAD 7 Score 0 0  Anxiety Difficulty Not difficult at all Not difficult at all    Pertinent labs & imaging results that were available during my care of the patient were reviewed by me and considered in my medical decision making.  Assessment & Plan:  Bryan Warner was seen today for rectal bleeding.  Diagnoses and all orders for this visit:  Rectal bleeding Encouraged patient to get dulcolax OTC liquid as he is not able to tolerate pill form. Encouraged patient to use hydrocortisone  cream after BM for irritation treatment. Will provide medication as below. If symptoms do not improve, will refer to GI.   -     polyethylene glycol powder (MIRALAX ) 17 GM/SCOOP powder; Take 255 g by mouth once for 1 dose.  Continue all other maintenance medications.  Follow up plan: Return if symptoms worsen or fail to improve.   Continue healthy lifestyle choices, including diet (rich in fruits, vegetables, and lean proteins, and low in salt and simple carbohydrates) and exercise (at least 30 minutes of moderate physical activity daily).  Written and verbal instructions provided   The above assessment and management plan was discussed with the patient. The patient verbalized understanding of and has agreed to the management plan. Patient is aware to call the clinic  if they develop any new symptoms or if symptoms persist or worsen. Patient is aware when to return to the clinic for a follow-up visit. Patient educated on when it is appropriate to go to the emergency department.   Marry Kins, DNP-FNP [facility] Medicine [address] [place], [place] [address] [phone]

## 2023-07-19 ENCOUNTER — Ambulatory Visit: Payer: Medicaid Other | Admitting: Family Medicine

## 2023-07-26 ENCOUNTER — Ambulatory Visit (INDEPENDENT_AMBULATORY_CARE_PROVIDER_SITE_OTHER): Payer: Medicaid Other | Admitting: Family Medicine

## 2023-07-26 ENCOUNTER — Encounter: Payer: Self-pay | Admitting: Family Medicine

## 2023-07-26 VITALS — BP 102/63 | HR 105 | Temp 98.2°F | Ht 71.5 in | Wt 118.0 lb

## 2023-07-26 DIAGNOSIS — K625 Hemorrhage of anus and rectum: Secondary | ICD-10-CM

## 2023-07-26 MED ORDER — DOCUSATE SODIUM 50 MG/5ML PO LIQD: 100.0000 mg | Freq: Every day | ORAL | 0 refills | Status: AC

## 2023-07-26 NOTE — Patient Instructions (Signed)
Thank you for coming in to clinic today.  1. Your symptoms are consistent with Constipation, likely cause of your General Abdominal Pain / Cramping. 2. Start with Miralax. First dose 68g (4 capfuls) in 32oz water over 1 to 2 hours for clean out. Next day start 17g or 1 capful daily, may adjust dose up or down by half a capful every few days. Recommend to take this medicine daily for next 1-2 weeks, you may need to use it longer if needed. - Goal is to have soft regular bowel movement 1-3x daily, if too runny or diarrhea, then reduce dose of the medicine to every other day.  Improve water intake, hydration will help Also recommend increased vegetables, fruits, fiber intake Can try daily Metamucil or Fiber supplement at pharmacy over the counter  Follow-up if symptoms are not improving with bowel movements, or if pain worsens, develop fevers, nausea, vomiting.  Please schedule a follow-up appointment in 1 month to follow-up Constipation  If you have any other questions or concerns, please feel free to call the clinic to contact me. You may also schedule an earlier appointment if necessary.  However, if your symptoms get significantly worse, please go to the Emergency Department to seek immediate medical attention.

## 2023-07-26 NOTE — Progress Notes (Signed)
Subjective:  Patient ID: Bryan Warner, male    DOB: Jan 22, 2001, 22 y.o.   MRN: [id number]  Patient Care Team: Ellamae Sia Aleen Campi, FNP as PCP - General (Family Medicine)   Chief Complaint:  Rectal Bleeding (2 week f/u)  HPI: Bryan Warner is a 23 y.o. male presenting on 07/26/2023 for Rectal Bleeding (2 week f/u) States that that it was reduced while he was using miralax, then returned. States that he tried miralax for 7 days. States that he started having hard stools again and is straining more with BM. Mainly sees blood with wiping. States that it happens with every bowel movement. BM happen every 2-4 days. States that he does not notice if he is passing gas daily. Denies any discomfort with constipation. Patient was not able to take oral colace due to inability to swallow pills.   Relevant past medical, surgical, family, and social history reviewed and updated as indicated.  Allergies and medications reviewed and updated. Data reviewed: Chart in Epic.  History reviewed. No pertinent past medical history.  History reviewed. No pertinent surgical history.  Social History   Socioeconomic History   Marital status: Single    Spouse name: Not on file   Number of children: Not on file   Years of education: Not on file   Highest education level: Not on file  Occupational History   Not on file  Tobacco Use   Smoking status: Never   Smokeless tobacco: Never  Vaping Use   Vaping status: Never Used  Substance and Sexual Activity   Alcohol use: No   Drug use: No   Sexual activity: Not on file  Other Topics Concern   Not on file  Social History Narrative   Not on file   Social Drivers of Health   Financial Resource Strain: Not on file  Food Insecurity: Not on file  Transportation Needs: Not on file  Physical Activity: Not on file  Stress: Not on file  Social Connections: Unknown (11/09/2021)   Received from [facility], [facility]   Social Network     Social Network: Not on file  Intimate Partner Violence: Unknown (10/01/2021)   Received from [facility], [facility]   HITS    Physically Hurt: Not on file    Insult or Talk Down To: Not on file    Threaten Physical Harm: Not on file    Scream or Curse: Not on file    Outpatient Encounter Medications as of 07/26/2023  Medication Sig   docusate sodium (COLACE) 100 MG capsule Take 1 capsule (100 mg total) by mouth 2 (two) times daily.   hydrocortisone cream 1 % Apply 1 Application topically 2 (two) times daily.   No facility-administered encounter medications on file as of 07/26/2023.    No Known Allergies  Review of Systems As per HPI  Objective:  BP 102/63   Pulse (!) 105   Temp 98.2 F (36.8 C)   Ht 5' 11.5" (1.816 m)   Wt 118 lb (53.5 kg)   SpO2 97%   BMI 16.23 kg/m    Wt Readings from Last 3 Encounters:  07/26/23 118 lb (53.5 kg)  07/05/23 119 lb (54 kg)  06/17/23 115 lb 9.6 oz (52.4 kg)    Physical Exam Constitutional:      General: He is awake. He is not in acute distress.    Appearance: Normal appearance. He is well-developed and well-groomed. He is not ill-appearing, toxic-appearing or diaphoretic.  Cardiovascular:     Rate and Rhythm: Regular rhythm. Tachycardia present.     Pulses: Normal pulses.          Radial pulses are 2+ on the right side and 2+ on the left side.       Posterior tibial pulses are 2+ on the right side and 2+ on the left side.     Heart sounds: Normal heart sounds. No murmur heard.    No gallop.  Pulmonary:     Effort: Pulmonary effort is normal. No respiratory distress.     Breath sounds: Normal breath sounds. No stridor. No wheezing, rhonchi or rales.  Abdominal:     General: Abdomen is flat. Bowel sounds are normal.     Palpations: Abdomen is soft.     Tenderness: There is no abdominal tenderness.     Hernia: No hernia is present.  Musculoskeletal:     Cervical back: Full passive range of motion without pain and neck  supple.     Right lower leg: No edema.     Left lower leg: No edema.  Skin:    General: Skin is warm.     Capillary Refill: Capillary refill takes less than 2 seconds.  Neurological:     General: No focal deficit present.     Mental Status: He is alert, oriented to person, place, and time and easily aroused. Mental status is at baseline.     GCS: GCS eye subscore is 4. GCS verbal subscore is 5. GCS motor subscore is 6.     Motor: No weakness.  Psychiatric:        Attention and Perception: Attention and perception normal.        Mood and Affect: Mood and affect normal.        Speech: Speech normal.        Behavior: Behavior normal. Behavior is cooperative.        Thought Content: Thought content normal. Thought content does not include homicidal or suicidal ideation. Thought content does not include homicidal or suicidal plan.        Cognition and Memory: Cognition and memory normal.        Judgment: Judgment normal.    Results for orders placed or performed in visit on 06/17/23  CBC with Differential/Platelet   Collection Time: 06/17/23  4:08 PM  Result Value Ref Range   WBC 6.2 3.4 - 10.8 x10E3/uL   RBC 5.83 (H) 4.14 - 5.80 x10E6/uL   Hemoglobin 13.8 13.0 - 17.7 g/dL   Hematocrit 09.8 11.9 - 51.0 %   MCV 74 (L) 79 - 97 fL   MCH 23.7 (L) 26.6 - 33.0 pg   MCHC 31.9 31.5 - 35.7 g/dL   RDW 14.7 82.9 - 56.2 %   Platelets 340 150 - 450 x10E3/uL   Neutrophils 46 Not Estab. %   Lymphs 45 Not Estab. %   Monocytes 7 Not Estab. %   Eos 1 Not Estab. %   Basos 1 Not Estab. %   Neutrophils Absolute 2.8 1.4 - 7.0 x10E3/uL   Lymphocytes Absolute 2.8 0.7 - 3.1 x10E3/uL   Monocytes Absolute 0.5 0.1 - 0.9 x10E3/uL   EOS (ABSOLUTE) 0.1 0.0 - 0.4 x10E3/uL   Basophils Absolute 0.0 0.0 - 0.2 x10E3/uL   Immature Granulocytes 0 Not Estab. %   Immature Grans (Abs) 0.0 0.0 - 0.1 x10E3/uL       07/26/2023   11:52 AM 07/05/2023    1:48 PM 04/08/2023  9:41 AM 10/25/2019    1:11 PM  Depression  screen PHQ 2/9  Decreased Interest 0 0 0 0  Down, Depressed, Hopeless 0 0 0 0  PHQ - 2 Score 0 0 0 0  Altered sleeping   1   Tired, decreased energy   0   Change in appetite   1   Feeling bad or failure about yourself    0   Trouble concentrating   0   Moving slowly or fidgety/restless   0   Suicidal thoughts   0   PHQ-9 Score   2   Difficult doing work/chores   Not difficult at all        07/26/2023   11:53 AM 07/05/2023    1:48 PM 04/08/2023    9:41 AM  GAD 7 : Generalized Anxiety Score  Nervous, Anxious, on Edge 0 0 0  Control/stop worrying 0 0 0  Worry too much - different things 0 0 0  Trouble relaxing 0 0 0  Restless 0 0 0  Easily annoyed or irritable 0 0 0  Afraid - awful might happen 0 0 0  Total GAD 7 Score 0 0 0  Anxiety Difficulty Not difficult at all Not difficult at all Not difficult at all   Pertinent labs & imaging results that were available during my care of the patient were reviewed by me and considered in my medical decision making.  Assessment & Plan:  Mavric was seen today for rectal bleeding.  Diagnoses and all orders for this visit:  Rectal bleeding Will start medication as below as patient is not able to tolerate pill form. Discussed increasing hydration and fiber in diet. Discussed that likely due to constipation.  Referral placed as below as patient continues to have bleeding without resolve.  -     Cancel: Ambulatory referral to Gastroenterology -     docusate (COLACE) 50 MG/5ML liquid; Take 10 mLs (100 mg total) by mouth daily. -     Ambulatory referral to Gastroenterology   Continue all other maintenance medications.  Follow up plan: Return if symptoms worsen or fail to improve.   Continue healthy lifestyle choices, including diet (rich in fruits, vegetables, and lean proteins, and low in salt and simple carbohydrates) and exercise (at least 30 minutes of moderate physical activity daily).  Written and verbal instructions provided    The above assessment and management plan was discussed with the patient. The patient verbalized understanding of and has agreed to the management plan. Patient is aware to call the clinic if they develop any new symptoms or if symptoms persist or worsen. Patient is aware when to return to the clinic for a follow-up visit. Patient educated on when it is appropriate to go to the emergency department.   Bryan Burly, DNP-FNP [facility] Medicine [address] [place], [place] [address] [phone]

## 2023-08-01 ENCOUNTER — Encounter (INDEPENDENT_AMBULATORY_CARE_PROVIDER_SITE_OTHER): Payer: Self-pay | Admitting: *Deleted

## 2023-08-10 ENCOUNTER — Ambulatory Visit (INDEPENDENT_AMBULATORY_CARE_PROVIDER_SITE_OTHER): Payer: Medicaid Other | Admitting: Gastroenterology

## 2023-08-10 ENCOUNTER — Ambulatory Visit (HOSPITAL_COMMUNITY)
Admission: RE | Admit: 2023-08-10 | Discharge: 2023-08-10 | Disposition: A | Discharge status: Home / Self Care | Payer: Medicaid Other | Source: Ambulatory Visit | Attending: Gastroenterology | Admitting: Gastroenterology

## 2023-08-10 ENCOUNTER — Encounter (INDEPENDENT_AMBULATORY_CARE_PROVIDER_SITE_OTHER): Payer: Self-pay | Admitting: Gastroenterology

## 2023-08-10 VITALS — BP 108/74 | HR 100 | Temp 98.0°F | Ht 71.5 in | Wt 118.3 lb

## 2023-08-10 DIAGNOSIS — K5909 Other constipation: Secondary | ICD-10-CM | POA: Diagnosis not present

## 2023-08-10 DIAGNOSIS — K5641 Fecal impaction: Secondary | ICD-10-CM | POA: Diagnosis not present

## 2023-08-10 DIAGNOSIS — K5904 Chronic idiopathic constipation: Secondary | ICD-10-CM | POA: Insufficient documentation

## 2023-08-10 DIAGNOSIS — Z681 Body mass index (BMI) 19 or less, adult: Secondary | ICD-10-CM | POA: Insufficient documentation

## 2023-08-10 DIAGNOSIS — K921 Melena: Secondary | ICD-10-CM | POA: Insufficient documentation

## 2023-08-10 MED ORDER — PEG 3350-KCL-NA BICARB-NACL 420 G PO SOLR: 4000.0000 mL | Freq: Once | ORAL | 0 refills | Status: AC

## 2023-08-10 MED ORDER — POLYETHYLENE GLYCOL 3350 17 G PO PACK: 17.0000 g | PACK | Freq: Two times a day (BID) | ORAL | 0 refills | Status: AC

## 2023-08-10 MED ORDER — PSYLLIUM 58.6 % PO PACK: 1.0000 | PACK | Freq: Two times a day (BID) | ORAL | 2 refills | Status: AC

## 2023-08-10 NOTE — Progress Notes (Signed)
[name] , M.D. Gastroenterology & Hepatology [facility] [address] [place], [place] [address] Primary Care Physician: Arrie Senate, FNP [address] [place] [address]  Chief Complaint:  chronic constipation and painless hematochezia.  History of Present Illness: Bryan Warner is a 23 y.o. male with no significant medical problems who presents for evaluation of chronic constipation and painless hematochezia.  Patient is accompanying with mother in the clinic.  Patient reports that for past 1 year he has been having fresh blood per rectum mostly upon wiping.  This is sometime mixed with stool.  Patient reports hard stool Bristol stool scale 1-2 bowel movement every 3 to 4 days with much straining.  Patient diet is poor any fluid intake and fiber.  Mostly eats fast food and junk food.  Patient mother reports as a baby he was given MiraLAX because he had constipation at that time as well.  Denies any workup for chronic constipation or possible Hirschsprung . The patient denies having any nausea, vomiting, fever, chills,  melena, hematemesis, abdominal distention, abdominal pain, diarrhea, jaundice, pruritus or weight loss.  Last NWG:NFAO Last Colonoscopy:none  FHx: neg for any gastrointestinal/liver disease, no malignancies Social: neg smoking, alcohol or illicit drug use Surgical: no abdominal surgeries  Family History: Family History  Problem Relation Age of Onset   Hypertension Mother    Diabetes Maternal Grandmother    Hypertension Maternal Grandmother    Hypertension Maternal Grandfather     Social History: Social History   Tobacco Use  Smoking Status Never  Smokeless Tobacco Never   Social History   Substance and Sexual Activity  Alcohol Use No   Social History   Substance and Sexual Activity  Drug Use No    Allergies: No Known Allergies  Medications: Current Outpatient  Medications  Medication Sig Dispense Refill   docusate (COLACE) 50 MG/5ML liquid Take 10 mLs (100 mg total) by mouth daily. 473 mL 0   hydrocortisone cream 1 % Apply 1 Application topically 2 (two) times daily. 30 g 0   polyethylene glycol (MIRALAX / GLYCOLAX) 17 g packet Take 17 g by mouth 2 (two) times daily. 180 packet 0   polyethylene glycol-electrolytes (NULYTELY) 420 g solution Take 4,000 mLs by mouth once for 1 dose. 4000 mL 0   psyllium (METAMUCIL) 58.6 % packet Take 1 packet by mouth 2 (two) times daily. 60 packet 2   No current facility-administered medications for this visit.    Review of Systems: GENERAL: negative for malaise, night sweats HEENT: No changes in hearing or vision, no nose bleeds or other nasal problems. NECK: Negative for lumps, goiter, pain and significant neck swelling RESPIRATORY: Negative for cough, wheezing CARDIOVASCULAR: Negative for chest pain, leg swelling, palpitations, orthopnea GI: SEE HPI MUSCULOSKELETAL: Negative for joint pain or swelling, back pain, and muscle pain. SKIN: Negative for lesions, rash HEMATOLOGY Negative for prolonged bleeding, bruising easily, and swollen nodes. ENDOCRINE: Negative for cold or heat intolerance, polyuria, polydipsia and goiter. NEURO: negative for tremor, gait imbalance, syncope and seizures. The remainder of the review of systems is noncontributory.   Physical Exam: BP 108/74   Pulse 100   Temp 98 F (36.7 C) (Oral)   Ht 5' 11.5" (1.816 m)   Wt 118 lb 4.8 oz (53.7 kg)   BMI 16.27 kg/m  GENERAL: The patient is AO x3, in no acute distress. HEENT: Head is normocephalic and atraumatic. EOMI are intact. Mouth is well hydrated  and without lesions. NECK: Supple. No masses LUNGS: Clear to auscultation. No presence of rhonchi/wheezing/rales. Adequate chest expansion HEART: RRR, normal s1 and s2. ABDOMEN: Soft, nontender, no guarding, no peritoneal signs, and nondistended. BS +. No masses.  Imaging/Labs: as  above     Latest Ref Rng & Units 06/17/2023    4:08 PM 04/08/2023    9:54 AM 06/11/2022   10:58 PM  CBC  WBC 3.4 - 10.8 x10E3/uL 6.2  5.8  8.5   Hemoglobin 13.0 - 17.7 g/dL 96.0  45.4  09.8   Hematocrit 37.5 - 51.0 % 43.3  41.9  43.8   Platelets 150 - 450 x10E3/uL 340  297  306    No results found for: "IRON", "TIBC", "FERRITIN"  I personally reviewed and interpreted the available labs, imaging and endoscopic files.  Impression and Plan:   Tino Ronan No is a 23 y.o. male with no significant medical problems who presents for evaluation of chronic constipation and painless hematochezia.  # Chronic constipation # Painless hematochezia  This is a patient who appears to have chronic constipation for most of his lifetime.  Mother reports giving MiraLAX as a daily.  Has a bowel movement every 3 to 4 days  I am concerned about adult type Hirschsprung Disease as patient appears to have constipation for most of his life  This could be chronic constipation due to poor lifestyle choices and diet, patient barely drinks any water and minimal fiber intake  Blood per rectum could be due to hemorrhoidal bleed but regardless of age is considered an alarm symptom and need to rule out inflammatory bowel disease at this time  Recs:  Ensure adequate fluid intake: Aim for 8 glasses of water daily. Follow a high fiber diet: Include foods such as dates, prunes, pears, and kiwi. Take Miralax twice a day for the first week, then reduce to once daily thereafter. Use Metamucil twice a day. Abdominal x-ray to evaluate for any colonic dilation  Will plan for ileocolonoscopy to evaluate for inflammatory bowel disease  If after lifestyle modification and laxatives patient has inadequate response would refer for balloon expulsion test and anal manometry to evaluate for defecatory disorder/adult type Hirschsprung Disease  All questions were answered.      [name],  MD Gastroenterology and Hepatology [facility]   This chart has been completed using [facility] Dictation software, and while attempts have been made to ensure accuracy , certain words and phrases may not be transcribed as intended

## 2023-08-10 NOTE — H&P (View-Only) (Signed)
 Bryan Bryan Warner , M.D. Gastroenterology & Hepatology [facility] [address] [place], [place] [address] Primary Care Physician: Bryan Senate, FNP [address] [place] [address]  Chief Complaint:  chronic constipation and painless hematochezia.  History of Present Illness: Bryan Bryan Warner is a 23 y.o. male with Bryan Warner significant medical problems who presents for evaluation of chronic constipation and painless hematochezia.  Patient is accompanying with mother in the clinic.  Patient reports that for past 1 year he has been having fresh blood per rectum mostly upon wiping.  This is sometime mixed with stool.  Patient reports hard stool Bristol stool scale 1-2 bowel movement every 3 to 4 days with much straining.  Patient diet is poor any fluid intake and fiber.  Mostly eats fast food and junk food.  Patient mother reports as a baby he was given MiraLAX because he had constipation at that time as well.  Denies any workup for chronic constipation or possible Hirschsprung . The patient denies having any nausea, vomiting, fever, chills,  melena, hematemesis, abdominal distention, abdominal pain, diarrhea, jaundice, pruritus or weight loss.  Last NWG:NFAO Last Colonoscopy:none  FHx: neg for any gastrointestinal/liver disease, Bryan Warner malignancies Social: neg smoking, alcohol or illicit drug use Surgical: Bryan Warner abdominal surgeries  Family History: Family History  Problem Relation Age of Onset   Hypertension Mother    Diabetes Maternal Grandmother    Hypertension Maternal Grandmother    Hypertension Maternal Grandfather     Social History: Social History   Tobacco Use  Smoking Status Never  Smokeless Tobacco Never   Social History   Substance and Sexual Activity  Alcohol Use Bryan Warner   Social History   Substance and Sexual Activity  Drug Use Bryan Warner    Allergies: Bryan Warner Known Allergies  Medications: Current Outpatient  Medications  Medication Sig Dispense Refill   docusate (COLACE) 50 MG/5ML liquid Take 10 mLs (100 mg total) by mouth daily. 473 mL 0   hydrocortisone cream 1 % Apply 1 Application topically 2 (two) times daily. 30 g 0   polyethylene glycol (MIRALAX / GLYCOLAX) 17 g packet Take 17 g by mouth 2 (two) times daily. 180 packet 0   polyethylene glycol-electrolytes (NULYTELY) 420 g solution Take 4,000 mLs by mouth once for 1 dose. 4000 mL 0   psyllium (METAMUCIL) 58.6 % packet Take 1 packet by mouth 2 (two) times daily. 60 packet 2   Bryan Warner current facility-administered medications for this visit.    Review of Systems: GENERAL: negative for malaise, night sweats HEENT: Bryan Warner changes in hearing or vision, Bryan Warner nose bleeds or other nasal problems. NECK: Negative for lumps, goiter, pain and significant neck swelling RESPIRATORY: Negative for cough, wheezing CARDIOVASCULAR: Negative for chest pain, leg swelling, palpitations, orthopnea GI: SEE HPI MUSCULOSKELETAL: Negative for joint pain or swelling, back pain, and muscle pain. SKIN: Negative for lesions, rash HEMATOLOGY Negative for prolonged bleeding, bruising easily, and swollen nodes. ENDOCRINE: Negative for cold or heat intolerance, polyuria, polydipsia and goiter. NEURO: negative for tremor, gait imbalance, syncope and seizures. The remainder of the review of systems is noncontributory.   Physical Exam: BP 108/74   Pulse 100   Temp 98 F (36.7 C) (Oral)   Ht 5' 11.5" (1.816 m)   Wt 118 lb 4.8 oz (53.7 kg)   BMI 16.27 kg/m  GENERAL: The patient is AO x3, in Bryan Warner acute distress. HEENT: Head is normocephalic and atraumatic. EOMI are intact. Mouth is well hydrated  and without lesions. NECK: Supple. Bryan Warner masses LUNGS: Clear to auscultation. Bryan Warner presence of rhonchi/wheezing/rales. Adequate chest expansion HEART: RRR, normal s1 and s2. ABDOMEN: Soft, nontender, Bryan Warner guarding, Bryan Warner peritoneal signs, and nondistended. BS +. Bryan Warner masses.  Imaging/Labs: as  above     Latest Ref Rng & Units 06/17/2023    4:08 PM 04/08/2023    9:54 AM 06/11/2022   10:58 PM  CBC  WBC 3.4 - 10.8 x10E3/uL 6.2  5.8  8.5   Hemoglobin 13.0 - 17.7 g/dL 96.0  45.4  09.8   Hematocrit 37.5 - 51.0 % 43.3  41.9  43.8   Platelets 150 - 450 x10E3/uL 340  297  306    Bryan Warner results found for: "IRON", "TIBC", "FERRITIN"  I personally reviewed and interpreted the available labs, imaging and endoscopic files.  Impression and Plan:   Bryan Bryan Warner is a 23 y.o. male with Bryan Warner significant medical problems who presents for evaluation of chronic constipation and painless hematochezia.  # Chronic constipation # Painless hematochezia  This is a patient who appears to have chronic constipation for most of his lifetime.  Mother reports giving MiraLAX as a daily.  Has a bowel movement every 3 to 4 days  I am concerned about adult type Hirschsprung Disease as patient appears to have constipation for most of his life  This could be chronic constipation due to poor lifestyle choices and diet, patient barely drinks any water and minimal fiber intake  Blood per rectum could be due to hemorrhoidal bleed but regardless of age is considered an alarm symptom and need to rule out inflammatory bowel disease at this time  Recs:  Ensure adequate fluid intake: Aim for 8 glasses of water daily. Follow a high fiber diet: Include foods such as dates, prunes, pears, and kiwi. Take Miralax twice a day for the first week, then reduce to once daily thereafter. Use Metamucil twice a day. Abdominal x-ray to evaluate for any colonic dilation  Will plan for ileocolonoscopy to evaluate for inflammatory bowel disease  If after lifestyle modification and laxatives patient has inadequate response would refer for balloon expulsion test and anal manometry to evaluate for defecatory disorder/adult type Hirschsprung Disease  All questions were answered.      Bryan Lawman,  MD Gastroenterology and Hepatology [facility]   This chart has been completed using [facility] Dictation software, and while attempts have been made to ensure accuracy , certain words and phrases may not be transcribed as intended

## 2023-08-10 NOTE — Patient Instructions (Signed)
It was very nice to meet you today, as dicussed with will plan for the following :  1) Ensure adequate fluid intake: Aim for 8 glasses of water daily. Follow a high fiber diet: Include foods such as dates, prunes, pears, and kiwi. Take Miralax twice a day for the first week, then reduce to once daily thereafter. Use Metamucil twice a day.  2) Abdominal xray  3) Colonoscopy

## 2023-08-15 ENCOUNTER — Encounter (INDEPENDENT_AMBULATORY_CARE_PROVIDER_SITE_OTHER): Payer: Self-pay | Admitting: Gastroenterology

## 2023-08-15 NOTE — Progress Notes (Signed)
 Xray abdomen   IMPRESSION: Stool scattered throughout the colon as can be seen with constipation.

## 2023-08-24 ENCOUNTER — Telehealth: Payer: Self-pay

## 2023-08-24 NOTE — Telephone Encounter (Signed)
 Copied from CRM [id number]. Topic: Clinical - Medical Advice >> Aug 24, 2023 12:28 PM Albin Felling L wrote: Reason for CRM:  Mom has concerns about liquid he is supposed to drink before colonoscopy procedure. Mom has questions she would like answered. Mom states patient has dehydrated in the past and passed out. Patient is scheduled for colonoscopy on Monday 08/29/2023.     Mom requesting call back, [phone]

## 2023-08-25 NOTE — Telephone Encounter (Signed)
 Left detailed message on moms voicemail to contact the GI office with any questions

## 2023-08-29 ENCOUNTER — Ambulatory Visit (HOSPITAL_COMMUNITY): Admitting: Anesthesiology

## 2023-08-29 ENCOUNTER — Ambulatory Visit (HOSPITAL_COMMUNITY)
Admission: RE | Admit: 2023-08-29 | Discharge: 2023-08-29 | Disposition: A | Discharge status: Home / Self Care | Payer: Medicaid Other | Attending: Gastroenterology | Admitting: Gastroenterology

## 2023-08-29 ENCOUNTER — Encounter (HOSPITAL_COMMUNITY)
Admission: RE | Disposition: A | Discharge status: Home / Self Care | Payer: Self-pay | Source: Home / Self Care | Attending: Gastroenterology

## 2023-08-29 ENCOUNTER — Encounter (HOSPITAL_COMMUNITY): Payer: Self-pay | Admitting: Gastroenterology

## 2023-08-29 ENCOUNTER — Other Ambulatory Visit: Payer: Self-pay

## 2023-08-29 DIAGNOSIS — K648 Other hemorrhoids: Secondary | ICD-10-CM

## 2023-08-29 DIAGNOSIS — I10 Essential (primary) hypertension: Secondary | ICD-10-CM | POA: Diagnosis not present

## 2023-08-29 DIAGNOSIS — K921 Melena: Secondary | ICD-10-CM

## 2023-08-29 DIAGNOSIS — K5909 Other constipation: Secondary | ICD-10-CM | POA: Insufficient documentation

## 2023-08-29 DIAGNOSIS — K649 Unspecified hemorrhoids: Secondary | ICD-10-CM | POA: Diagnosis not present

## 2023-08-29 DIAGNOSIS — K6389 Other specified diseases of intestine: Secondary | ICD-10-CM | POA: Diagnosis not present

## 2023-08-29 DIAGNOSIS — K640 First degree hemorrhoids: Secondary | ICD-10-CM

## 2023-08-29 HISTORY — PX: BIOPSY: SHX5522

## 2023-08-29 HISTORY — PX: COLONOSCOPY WITH PROPOFOL: SHX5780

## 2023-08-29 SURGERY — COLONOSCOPY WITH PROPOFOL
Anesthesia: General

## 2023-08-29 MED ORDER — PHENYLEPHRINE 80 MCG/ML (10ML) SYRINGE FOR IV PUSH (FOR BLOOD PRESSURE SUPPORT)
PREFILLED_SYRINGE | INTRAVENOUS | Status: AC
  Filled 2023-08-29: qty 30

## 2023-08-29 MED ORDER — LIDOCAINE HCL (CARDIAC) PF 100 MG/5ML IV SOSY
PREFILLED_SYRINGE | INTRAVENOUS | Status: DC | PRN
  Administered 2023-08-29: 50 mg via INTRAVENOUS

## 2023-08-29 MED ORDER — LACTATED RINGERS IV SOLN: INTRAVENOUS | Status: DC | PRN

## 2023-08-29 MED ORDER — MIDAZOLAM HCL 2 MG/2ML IJ SOLN
INTRAMUSCULAR | Status: DC | PRN
  Administered 2023-08-29: 2 mg via INTRAVENOUS

## 2023-08-29 MED ORDER — PROPOFOL 500 MG/50ML IV EMUL
INTRAVENOUS | Status: DC | PRN
  Administered 2023-08-29: 200 ug/kg/min via INTRAVENOUS

## 2023-08-29 MED ORDER — STERILE WATER FOR IRRIGATION IR SOLN
Status: DC | PRN
  Administered 2023-08-29: 60 mL

## 2023-08-29 MED ORDER — MIDAZOLAM HCL 2 MG/2ML IJ SOLN
INTRAMUSCULAR | Status: AC
  Filled 2023-08-29: qty 2

## 2023-08-29 MED ORDER — LACTATED RINGERS IV SOLN: INTRAVENOUS | Status: DC

## 2023-08-29 MED ORDER — PROPOFOL 1000 MG/100ML IV EMUL
INTRAVENOUS | Status: AC
  Filled 2023-08-29: qty 100

## 2023-08-29 MED ORDER — PROPOFOL 10 MG/ML IV BOLUS
INTRAVENOUS | Status: DC | PRN
  Administered 2023-08-29: 100 mg via INTRAVENOUS

## 2023-08-29 MED ORDER — PROPOFOL 500 MG/50ML IV EMUL
INTRAVENOUS | Status: AC
  Filled 2023-08-29: qty 50

## 2023-08-29 MED ORDER — PHENYLEPHRINE 80 MCG/ML (10ML) SYRINGE FOR IV PUSH (FOR BLOOD PRESSURE SUPPORT)
PREFILLED_SYRINGE | INTRAVENOUS | Status: DC | PRN
  Administered 2023-08-29 (×4): 160 ug via INTRAVENOUS

## 2023-08-29 NOTE — Discharge Instructions (Signed)

## 2023-08-29 NOTE — Op Note (Signed)
 [facility] Patient Name: Bryan Warner Procedure Date: 08/29/2023 8:27 AM MRN: [id number] Date of Birth: 2001-04-20 Attending MD: Sanjuan Dame , MD, [id number] CSN: [id number] Age: 23 Admit Type: Outpatient Procedure:                Colonoscopy Indications:              Hematochezia Providers:                Sanjuan Dame, MD, Angelica Ran, Elinor Parkinson Referring MD:              Medicines:                Monitored Anesthesia Care Complications:            No immediate complications. Estimated Blood Loss:     Estimated blood loss: none. Procedure:                Pre-Anesthesia Assessment:                           - Prior to the procedure, a History and Physical                            was performed, and patient medications and                            allergies were reviewed. The patient's tolerance of                            previous anesthesia was also reviewed. The risks                            and benefits of the procedure and the sedation                            options and risks were discussed with the patient.                            All questions were answered, and informed consent                            was obtained. Prior Anticoagulants: The patient has                            taken no anticoagulant or antiplatelet agents. ASA                            Grade Assessment: I - A normal, healthy patient.                            After reviewing the risks and benefits, the patient                            was deemed in satisfactory condition to undergo the  procedure.                           After obtaining informed consent, the colonoscope                            was passed under direct vision. Throughout the                            procedure, the patient's blood pressure, pulse, and                            oxygen saturations were monitored continuously. The                             [phone]) scope was introduced through the                            anus and advanced to the the terminal ileum. The                            colonoscopy was performed without difficulty. The                            patient tolerated the procedure well. The quality                            of the bowel preparation was evaluated using the                            BBPS [facility] Bowel Preparation Scale) with scores                            of: Right Colon = 3, Transverse Colon = 3 and Left                            Colon = 3 (entire mucosa seen well with no residual                            staining, small fragments of stool or opaque                            liquid). The total BBPS score equals 9. The                            terminal ileum, ileocecal valve, appendiceal                            orifice, and rectum were photographed. Scope In: 8:50:50 AM Scope Out: 9:07:12 AM Scope Withdrawal Time: 0 hours 9 minutes 9 seconds  Total Procedure Duration: 0 hours 16 minutes 22 seconds  Findings:      An area of mucosa in the terminal ileum was nodular. Biopsies were taken  with a cold forceps for histology.      The exam was otherwise without abnormality.      Internal hemorrhoids were found during retroflexion. The hemorrhoids       were small. Impression:               - Nodular ileal mucosa. Biopsied.                           - The examination was otherwise normal.                           - Internal hemorrhoids. Moderate Sedation:      Per Anesthesia Care Recommendation:           - Patient has a contact number available for                            emergencies. The signs and symptoms of potential                            delayed complications were discussed with the                            patient. Return to normal activities tomorrow.                            Written discharge instructions were provided to the                             patient.                           - High fiber diet.                           -If patient continues to have constipation despite                            lifestyle changes and metamucil/miralax , would                            recommened Anorectal manometry with Balloon                            expulsion test                           - Continue present medications.                           - Repeat colonoscopy at age 89 for screening                            purposes. Procedure Code(s):        --- Professional ---                           [phone], Colonoscopy,  flexible; diagnostic, including                            collection of specimen(s) by brushing or washing,                            when performed (separate procedure) Diagnosis Code(s):        --- Professional ---                           K64.8, Other hemorrhoids                           K92.1, Melena (includes Hematochezia) CPT copyright 2022 American Medical Association. All rights reserved. The codes documented in this report are preliminary and upon coder review may  be revised to meet current compliance requirements. Sanjuan Dame, MD Sanjuan Dame, MD 08/29/2023 9:16:26 AM This report has been signed electronically. Number of Addenda: 0

## 2023-08-29 NOTE — Transfer of Care (Addendum)
 Immediate Anesthesia Transfer of Care Note  Patient: Bryan Warner  Procedure(s) Performed: COLONOSCOPY WITH PROPOFOL BIOPSY  Patient Location: Short Stay  Anesthesia Type:General  Level of Consciousness: awake, alert , oriented, and patient cooperative  Airway & Oxygen Therapy: Patient Spontanous Breathing  Post-op Assessment: Report given to RN, Post -op Vital signs reviewed and stable, and Patient moving all extremities X 4  Post vital signs: Reviewed and stable  Last Vitals:  Vitals Value Taken Time  BP 86/47 08/29/23 0910  Temp 36.8 C 08/29/23 0910  Pulse 82 08/29/23 0910  Resp 19 08/29/23 0910  SpO2 97 % 08/29/23 0910    Last Pain:  Vitals:   08/29/23 0910  TempSrc: Axillary  PainSc: 0-No pain      Patients Stated Pain Goal: 7 (08/29/23 0711)  Complications: No notable events documented.

## 2023-08-29 NOTE — Anesthesia Preprocedure Evaluation (Signed)
 Anesthesia Evaluation  Patient identified by MRN, date of birth, ID band Patient awake    Reviewed: Allergy & Precautions, H&P , NPO status , Patient's Chart, lab work & pertinent test results, reviewed documented beta blocker date and time   Airway Mallampati: II  TM Distance: >3 FB Neck ROM: full    Dental no notable dental hx.    Pulmonary neg pulmonary ROS   Pulmonary exam normal breath sounds clear to auscultation       Cardiovascular Exercise Tolerance: Good hypertension, negative cardio ROS  Rhythm:regular Rate:Normal     Neuro/Psych negative neurological ROS  negative psych ROS   GI/Hepatic negative GI ROS, Neg liver ROS,,,  Endo/Other  negative endocrine ROS    Renal/GU negative Renal ROS  negative genitourinary   Musculoskeletal   Abdominal   Peds  Hematology negative hematology ROS (+)   Anesthesia Other Findings   Reproductive/Obstetrics negative OB ROS                             Anesthesia Physical Anesthesia Plan  ASA: 1  Anesthesia Plan: General   Post-op Pain Management:    Induction:   PONV Risk Score and Plan: Propofol infusion  Airway Management Planned:   Additional Equipment:   Intra-op Plan:   Post-operative Plan:   Informed Consent: I have reviewed the patients History and Physical, chart, labs and discussed the procedure including the risks, benefits and alternatives for the proposed anesthesia with the patient or authorized representative who has indicated his/her understanding and acceptance.     Dental Advisory Given  Plan Discussed with: CRNA  Anesthesia Plan Comments:        Anesthesia Quick Evaluation

## 2023-08-29 NOTE — Interval H&P Note (Signed)
 History and Physical Interval Note:  08/29/2023 7:24 AM  Bryan Warner  has presented today for surgery, with the diagnosis of HEMOTOCHEZIA.  The various methods of treatment have been discussed with the patient and family. After consideration of risks, benefits and other options for treatment, the patient has consented to  Procedure(s) with comments: COLONOSCOPY WITH PROPOFOL (N/A) - 8:15AM;ASA 1-2 as a surgical intervention.  The patient's history has been reviewed, patient examined, no change in status, stable for surgery.  I have reviewed the patient's chart and labs.  Questions were answered to the patient's satisfaction.     [name] [name]

## 2023-08-30 ENCOUNTER — Encounter (HOSPITAL_COMMUNITY): Payer: Self-pay | Admitting: Gastroenterology

## 2023-08-30 LAB — SURGICAL PATHOLOGY

## 2023-08-30 NOTE — Anesthesia Postprocedure Evaluation (Signed)
 Anesthesia Post Note  Patient: Bryan Warner  Procedure(s) Performed: COLONOSCOPY WITH PROPOFOL BIOPSY  Patient location during evaluation: Phase II Anesthesia Type: General Level of consciousness: awake Pain management: pain level controlled Vital Signs Assessment: post-procedure vital signs reviewed and stable Respiratory status: spontaneous breathing and respiratory function stable Cardiovascular status: blood pressure returned to baseline and stable Postop Assessment: no headache and no apparent nausea or vomiting Anesthetic complications: no Comments: Late entry   No notable events documented.   Last Vitals:  Vitals:   08/29/23 0936 08/29/23 0941  BP: (!) 107/56 114/64  Pulse: 81 87  Resp: 18 17  Temp:    SpO2: 99% 99%    Last Pain:  Vitals:   08/29/23 0936  TempSrc:   PainSc: 0-No pain                 Windell Norfolk

## 2023-09-08 ENCOUNTER — Encounter (INDEPENDENT_AMBULATORY_CARE_PROVIDER_SITE_OTHER): Payer: Self-pay | Admitting: *Deleted

## 2023-09-15 ENCOUNTER — Encounter (INDEPENDENT_AMBULATORY_CARE_PROVIDER_SITE_OTHER): Payer: Self-pay | Admitting: Gastroenterology

## 2023-10-26 ENCOUNTER — Ambulatory Visit (INDEPENDENT_AMBULATORY_CARE_PROVIDER_SITE_OTHER): Admitting: Gastroenterology

## 2023-10-26 VITALS — BP 117/71 | HR 109 | Temp 97.8°F | Ht 71.0 in | Wt 119.6 lb

## 2023-10-26 DIAGNOSIS — K5904 Chronic idiopathic constipation: Secondary | ICD-10-CM

## 2023-10-26 DIAGNOSIS — K921 Melena: Secondary | ICD-10-CM

## 2023-10-26 DIAGNOSIS — K5909 Other constipation: Secondary | ICD-10-CM

## 2023-10-26 DIAGNOSIS — Z681 Body mass index (BMI) 19 or less, adult: Secondary | ICD-10-CM

## 2023-10-26 NOTE — Progress Notes (Signed)
 Mitsue Peery Faizan Kourtnie Sachs , M.D. Gastroenterology & Hepatology [facility] [address] [place], [place] [address] Primary Care Physician: Chrystine Crate, FNP [address] [place] [address]  Chief Complaint:  chronic constipation and painless hematochezia.  History of Present Illness: Bryan Warner is a 23 y.o. male with no significant medical problems who presents for evaluation of chronic constipation and painless hematochezia.  Patient was last seen February 2025 where he underwent an colonoscopy with biopsies which were negative  Today patient reports that his constipation has much improved with high-fiber diet and drinking adequate amount of water .  Patient reports he is having 1-2 bowel movement every 1 to 2 days without any straining Bristol stool scale type III-IV.  No further hematochezia noticed. Patient mother reports as a baby he was given MiraLAX  because he had constipation at that time as well.  . The patient denies having any nausea, vomiting, fever, chills,  melena, hematemesis, abdominal distention, abdominal pain, diarrhea, jaundice, pruritus or weight loss.  Last OZH:YQMV Last Colonoscopy:08/2023  - Nodular ileal mucosa. Biopsied. - The examination was otherwise normal. - Internal hemorrhoids.  A. TERMINAL ILEUM, BIOPSY:  - Ileal mucosa with no specific histopathologic changes  - Negative for acute inflammation, features of chronicity or granulomas   Repeat colonoscopy at age 67 for screening purposes.  FHx: neg for any gastrointestinal/liver disease, no malignancies Social: neg smoking, alcohol or illicit drug use Surgical: no abdominal surgeries  IMPRESSION: Stool scattered throughout the colon as can be seen with constipation.  Family History: Family History  Problem Relation Age of Onset   Hypertension Mother    Diabetes Maternal Grandmother    Hypertension Maternal Grandmother     Hypertension Maternal Grandfather     Social History: Social History   Tobacco Use  Smoking Status Never  Smokeless Tobacco Never   Social History   Substance and Sexual Activity  Alcohol Use No   Social History   Substance and Sexual Activity  Drug Use No    Allergies: No Known Allergies  Medications: Current Outpatient Medications  Medication Sig Dispense Refill   docusate (COLACE) 50 MG/5ML liquid Take 10 mLs (100 mg total) by mouth daily. (Patient not taking: Reported on 10/26/2023) 473 mL 0   hydrocortisone  cream 1 % Apply 1 Application topically 2 (two) times daily. (Patient not taking: Reported on 10/26/2023) 30 g 0   polyethylene glycol (MIRALAX  / GLYCOLAX ) 17 g packet Take 17 g by mouth 2 (two) times daily. (Patient not taking: Reported on 10/26/2023) 180 packet 0   psyllium (METAMUCIL) 58.6 % packet Take 1 packet by mouth 2 (two) times daily. (Patient not taking: Reported on 10/26/2023) 60 packet 2   No current facility-administered medications for this visit.    Review of Systems: GENERAL: negative for malaise, night sweats HEENT: No changes in hearing or vision, no nose bleeds or other nasal problems. NECK: Negative for lumps, goiter, pain and significant neck swelling RESPIRATORY: Negative for cough, wheezing CARDIOVASCULAR: Negative for chest pain, leg swelling, palpitations, orthopnea GI: SEE HPI MUSCULOSKELETAL: Negative for joint pain or swelling, back pain, and muscle pain. SKIN: Negative for lesions, rash HEMATOLOGY Negative for prolonged bleeding, bruising easily, and swollen nodes. ENDOCRINE: Negative for cold or heat intolerance, polyuria, polydipsia and goiter. NEURO: negative for tremor, gait imbalance, syncope and seizures. The remainder of the review of systems is noncontributory.   Physical Exam: BP 117/71   Pulse (!) 109   Temp 97.8  F (36.6 C)   Ht 5\' 11"  (1.803 m)   Wt 119 lb 9.6 oz (54.3 kg)   BMI 16.68 kg/m  GENERAL: The patient  is AO x3, in no acute distress. HEENT: Head is normocephalic and atraumatic. EOMI are intact. Mouth is well hydrated and without lesions. NECK: Supple. No masses LUNGS: Clear to auscultation. No presence of rhonchi/wheezing/rales. Adequate chest expansion HEART: RRR, normal s1 and s2. ABDOMEN: Soft, nontender, no guarding, no peritoneal signs, and nondistended. BS +. No masses.  Imaging/Labs: as above     Latest Ref Rng & Units 06/17/2023    4:08 PM 04/08/2023    9:54 AM 06/11/2022   10:58 PM  CBC  WBC 3.4 - 10.8 x10E3/uL 6.2  5.8  8.5   Hemoglobin 13.0 - 17.7 g/dL 16.1  09.6  04.5   Hematocrit 37.5 - 51.0 % 43.3  41.9  43.8   Platelets 150 - 450 x10E3/uL 340  297  306    No results found for: "IRON", "TIBC", "FERRITIN"  I personally reviewed and interpreted the available labs, imaging and endoscopic files.  Impression and Plan:   Bryan Warner is a 23 y.o. male with no significant medical problems who presents for evaluation of chronic constipation and painless hematochezia.  # Chronic constipation-improved # Painless hematochezia-resolved   Patient was last seen February 2025 where he underwent an colonoscopy with biopsies which were negative  Patient constipation has significantly improved with lifestyle modification adequate amount of fluids and high-fiber diet  This is a patient who appears to have chronic constipation for most of his lifetime.  Mother reports giving MiraLAX  as a baby .  Recs:  Ensure adequate fluid intake: Aim for 8 glasses of water  daily. Follow a high fiber diet: Include foods such as dates, prunes, pears, and kiwi.  If after lifestyle modification and laxatives patient has inadequate response would refer for balloon expulsion test and anal manometry to evaluate for defecatory disorder/adult type Hirschsprung Disease  All questions were answered.      Niesha Bame Faizan Larrie Lucia, MD Gastroenterology and Hepatology [facility]  Gastroenterology   This chart has been completed using [facility] Dictation software, and while attempts have been made to ensure accuracy , certain words and phrases may not be transcribed as intended

## 2023-10-28 ENCOUNTER — Encounter: Payer: Self-pay | Admitting: *Deleted

## 2024-04-10 ENCOUNTER — Encounter: Payer: Medicaid Other | Admitting: Family Medicine

## 2024-08-08 ENCOUNTER — Encounter: Payer: Self-pay | Admitting: Family Medicine
